# Patient Record
Sex: Male | Born: 1965 | Race: White | Hispanic: No | Smoking: Former smoker
Health system: Southern US, Community
[De-identification: ages and names within clinical notes are randomized; demographics above are authoritative.]

## PROBLEM LIST (undated history)

## (undated) DIAGNOSIS — F418 Other specified anxiety disorders: Secondary | ICD-10-CM

## (undated) DIAGNOSIS — K219 Gastro-esophageal reflux disease without esophagitis: Secondary | ICD-10-CM

## (undated) DIAGNOSIS — E785 Hyperlipidemia, unspecified: Secondary | ICD-10-CM

## (undated) DIAGNOSIS — G4730 Sleep apnea, unspecified: Secondary | ICD-10-CM

## (undated) DIAGNOSIS — I10 Essential (primary) hypertension: Secondary | ICD-10-CM

## (undated) DIAGNOSIS — M1990 Unspecified osteoarthritis, unspecified site: Secondary | ICD-10-CM

## (undated) DIAGNOSIS — Z972 Presence of dental prosthetic device (complete) (partial): Secondary | ICD-10-CM

## (undated) DIAGNOSIS — R519 Headache, unspecified: Secondary | ICD-10-CM

## (undated) DIAGNOSIS — E119 Type 2 diabetes mellitus without complications: Secondary | ICD-10-CM

## (undated) DIAGNOSIS — M549 Dorsalgia, unspecified: Secondary | ICD-10-CM

## (undated) HISTORY — DX: Presence of dental prosthetic device (complete) (partial): Z97.2

## (undated) HISTORY — DX: Essential (primary) hypertension: I10

## (undated) HISTORY — PX: HAND SURGERY: SHX662

## (undated) HISTORY — PX: CARDIAC CATHETERIZATION: SHX172

## (undated) HISTORY — DX: Headache, unspecified: R51.9

## (undated) HISTORY — DX: Hyperlipidemia, unspecified: E78.5

## (undated) HISTORY — PX: CARPAL TUNNEL RELEASE: SHX101

## (undated) HISTORY — DX: Type 2 diabetes mellitus without complications: E11.9

## (undated) HISTORY — DX: Other specified anxiety disorders: F41.8

## (undated) HISTORY — DX: Gastro-esophageal reflux disease without esophagitis: K21.9

## (undated) HISTORY — DX: Dorsalgia, unspecified: M54.9

## (undated) HISTORY — PX: COLONOSCOPY: SHX174

---

## 1998-10-03 ENCOUNTER — Encounter (HOSPITAL_COMMUNITY): Admission: RE | Admit: 1998-10-03 | Discharge: 1998-11-25 | Payer: Self-pay | Admitting: Psychiatry

## 2001-01-13 ENCOUNTER — Ambulatory Visit: Admission: RE | Admit: 2001-01-13 | Discharge: 2001-01-13 | Payer: Self-pay | Admitting: Family Medicine

## 2001-06-20 ENCOUNTER — Encounter: Admission: RE | Admit: 2001-06-20 | Discharge: 2001-09-18 | Payer: Self-pay | Admitting: Family Medicine

## 2002-10-11 ENCOUNTER — Encounter: Admission: RE | Admit: 2002-10-11 | Discharge: 2002-10-11 | Payer: Self-pay | Admitting: *Deleted

## 2002-10-11 ENCOUNTER — Encounter: Payer: Self-pay | Admitting: *Deleted

## 2002-10-12 ENCOUNTER — Ambulatory Visit (HOSPITAL_COMMUNITY): Admission: RE | Admit: 2002-10-12 | Discharge: 2002-10-12 | Payer: Self-pay | Admitting: *Deleted

## 2011-10-20 ENCOUNTER — Other Ambulatory Visit: Payer: Self-pay | Admitting: Family Medicine

## 2011-10-22 ENCOUNTER — Other Ambulatory Visit: Payer: Self-pay | Admitting: Family Medicine

## 2013-05-10 ENCOUNTER — Other Ambulatory Visit: Payer: Self-pay | Admitting: Physician Assistant

## 2013-05-12 ENCOUNTER — Other Ambulatory Visit: Payer: Self-pay | Admitting: Physician Assistant

## 2013-12-22 ENCOUNTER — Other Ambulatory Visit: Payer: Self-pay | Admitting: Physician Assistant

## 2013-12-22 ENCOUNTER — Ambulatory Visit
Admission: RE | Admit: 2013-12-22 | Discharge: 2013-12-22 | Disposition: A | Discharge status: Home / Self Care | Payer: Managed Care, Other (non HMO) | Source: Ambulatory Visit | Attending: Physician Assistant | Admitting: Physician Assistant

## 2013-12-22 DIAGNOSIS — R609 Edema, unspecified: Secondary | ICD-10-CM

## 2013-12-22 DIAGNOSIS — R52 Pain, unspecified: Secondary | ICD-10-CM

## 2013-12-22 DIAGNOSIS — IMO0001 Reserved for inherently not codable concepts without codable children: Secondary | ICD-10-CM

## 2014-08-11 ENCOUNTER — Ambulatory Visit (INDEPENDENT_AMBULATORY_CARE_PROVIDER_SITE_OTHER): Payer: Managed Care, Other (non HMO) | Admitting: Emergency Medicine

## 2014-08-11 ENCOUNTER — Ambulatory Visit (INDEPENDENT_AMBULATORY_CARE_PROVIDER_SITE_OTHER): Payer: Managed Care, Other (non HMO)

## 2014-08-11 VITALS — BP 116/64 | HR 81 | Temp 98.4°F | Resp 16 | Ht 72.0 in | Wt 212.0 lb

## 2014-08-11 DIAGNOSIS — M4310 Spondylolisthesis, site unspecified: Secondary | ICD-10-CM

## 2014-08-11 DIAGNOSIS — M62838 Other muscle spasm: Secondary | ICD-10-CM

## 2014-08-11 DIAGNOSIS — M545 Low back pain, unspecified: Secondary | ICD-10-CM

## 2014-08-11 LAB — POCT URINALYSIS DIPSTICK
Bilirubin, UA: NEGATIVE
Blood, UA: NEGATIVE
GLUCOSE UA: 500
Ketones, UA: NEGATIVE
Leukocytes, UA: NEGATIVE
Nitrite, UA: NEGATIVE
Protein, UA: NEGATIVE
Spec Grav, UA: 1.01
Urobilinogen, UA: 0.2
pH, UA: 5

## 2014-08-11 LAB — POCT UA - MICROSCOPIC ONLY
Bacteria, U Microscopic: NEGATIVE
CASTS, UR, LPF, POC: NEGATIVE
Crystals, Ur, HPF, POC: NEGATIVE
Mucus, UA: NEGATIVE
RBC, urine, microscopic: NEGATIVE
YEAST UA: NEGATIVE

## 2014-08-11 MED ORDER — TIZANIDINE HCL 4 MG PO TABS: 4.0000 mg | ORAL_TABLET | Freq: Every day | ORAL | Status: DC

## 2014-08-11 NOTE — Progress Notes (Signed)
08/11/2014 at 3:54 PM  Jeff Day / DOB: 01/28/1966 / MRN: [id number]  The patient  does not have a problem list on file.  SUBJECTIVE  Chief complaint: Back Pain   Jeff Day is a 49 y.o. male complaining of stable "sharp" right side lower right back pain that started 10 days ago. Associated symptoms include no other symptoms, and he denies weakness, numbness, tingling, dysuria, leg pain, leg weakness.Treatments tried thus far include prescription NSAIDS and acetaminophen with fair  relief. He denies fever, nausea, dysuria, frequency and urgency.  He has a 7 year history of diabettes and started taking an SGLT2 inhibitor in September of 2015.    He  has no past medical history on file.    Medications reviewed and updated by myself where necessary, and exist elsewhere in the encounter.   Jeff Day has No Known Allergies. He  reports that he has quit smoking. He does not have any smokeless tobacco history on file. He  has no sexual activity history on file. The patient  has no past surgical history on file.  His family history is not on file.  ROS  Per HPI  OBJECTIVE  His  height is 6' (1.829 m) and weight is 212 lb (96.163 kg). His temperature is 98.4 F (36.9 C). His blood pressure is 116/64 and his pulse is 81. His respiration is 16 and oxygen saturation is 98%.  The patient's body mass index is 28.75 kg/(m^2).  Physical Exam  Constitutional: He is oriented to person, place, and time. He appears well-developed.  Cardiovascular: Normal rate.   Respiratory: Effort normal.  GI: Soft.  Musculoskeletal: Normal range of motion.       Lumbar back: He exhibits normal range of motion, no tenderness, no bony tenderness and no spasm.       Back:  Neurological: He is alert and oriented to person, place, and time. He has normal strength. No cranial nerve deficit. He exhibits normal muscle tone. Gait normal.  Reflex Scores:      Patellar reflexes are 1+ on the right side and 1+  on the left side.      Achilles reflexes are 1+ on the right side and 1+ on the left side. Skin: He is not diaphoretic.    Results for orders placed or performed in visit on 08/11/14 (from the past 24 hour(s))  POCT urinalysis dipstick     Status: None   Collection Time: 08/11/14  8:54 AM  Result Value Ref Range   Color, UA yellow    Clarity, UA clear    Glucose, UA 500    Bilirubin, UA neg    Ketones, UA neg    Spec Grav, UA 1.010    Blood, UA neg    pH, UA 5.0    Protein, UA neg    Urobilinogen, UA 0.2    Nitrite, UA neg    Leukocytes, UA Negative   POCT UA - Microscopic Only     Status: None   Collection Time: 08/11/14  8:54 AM  Result Value Ref Range   WBC, Ur, HPF, POC 0-1    RBC, urine, microscopic neg    Bacteria, U Microscopic neg    Mucus, UA neg    Epithelial cells, urine per micros 0-2    Crystals, Ur, HPF, POC neg    Casts, Ur, LPF, POC neg    Yeast, UA neg    [facility] reading (PRIMARY) by  Dr. Cleta Albertsaub: Grade 1  anterolisthesis of L5 on S1. No other osseous abnormality.   ASSESSMENT & PLAN  Jeff Day was seen today for back pain.  Diagnoses and all orders for this visit:  Right-sided low back pain without sciatica: Advised patient to take tylenol 1 gram q8 hours.  He is to avoid Aleve, Ibuprofen, ASA, and other NSAID pain medications as he is taking Celebrex daily.  Orders: -     POCT urinalysis dipstick -     POCT UA - Microscopic Only -     DG Lumbar Spine Complete; Future  Spondylisthesis Orders: -     Ambulatory referral to Physical Therapy -     Ambulatory referral to Neurosurgery  Muscle spasm: Orders: -     tiZANidine (ZANAFLEX) 4 MG tablet; Take 1 tablet (4 mg total) by mouth at bedtime.    The patient was advised to call or come back to clinic if he does not see an improvement in symptoms, or worsens with the above plan.   Deliah Boston, MHS, PA-C [facility] 08/11/2014 3:54 PM

## 2014-09-07 ENCOUNTER — Ambulatory Visit: Payer: Managed Care, Other (non HMO) | Admitting: Neurology

## 2014-09-10 ENCOUNTER — Ambulatory Visit: Payer: Self-pay | Admitting: Neurology

## 2014-09-11 ENCOUNTER — Encounter: Payer: Self-pay | Admitting: Neurology

## 2014-09-11 ENCOUNTER — Ambulatory Visit (INDEPENDENT_AMBULATORY_CARE_PROVIDER_SITE_OTHER): Payer: Managed Care, Other (non HMO) | Admitting: Neurology

## 2014-09-11 VITALS — BP 120/79 | HR 60 | Ht 72.0 in | Wt 209.0 lb

## 2014-09-11 DIAGNOSIS — M545 Low back pain: Secondary | ICD-10-CM | POA: Diagnosis not present

## 2014-09-11 NOTE — Progress Notes (Signed)
PATIENT: Jeff OchoaJames L Day DOB: 10/12/1965  Chief Complaint  Patient presents with  . Back Pain    Reports worsening of back pain recently.  He becomes more uncomfortable with walking and lying on his right side.  Says the pain does not radiate to legs.  He recently had an abnormal x-ray with his PCP.    HISTORICAL  Jeff Day is a 49 years old right-handed male, accompanied by his wife, referred by primary care physician nurse practitioner Jeff LloydNoelle Day for evaluation of low back pain.  He has history of hypertension, hyperlipidemia, diabetes, is a truck driver, also require heavy lifting  He reported a history of low back pain for more than 12 years, intermittent, in April 2015 after pulling heavy object across the yard, he began to have worsening low back pain, across midline lower back, no radiating pain, no persistent bilateral lower extremity burns no weakness, no gait difficulty, he was able to continue his job  Prolonged walking, such [place] [employer] would trigger his low back pain, he denies bowel and bladder incontinence, no gait difficulty,  Tylenol 500 mg 3 tablets twice a day was helpful, he had a history of acid reflux, hiatal hernia, could not tolerate NSAIDs  We have reviewed x-ray August 11 2014, Study is limited by poor technique. No obvious vertebral compression fracture. Moderate narrowing of the L4-5 and L5-S1 discs. Slight anterolisthesis L4 upon L5  REVIEW OF SYSTEMS: Full 14 system review of systems performed and notable only for easy bruising, cramps, aching muscles  ALLERGIES: No Known Allergies  HOME MEDICATIONS: Current Outpatient Prescriptions  Medication Sig Dispense Refill  . ACCU-CHEK AVIVA PLUS test strip USE AS DIRECTED 100 strip 0  . celecoxib (CELEBREX) 200 MG capsule Take 200 mg by mouth 2 (two) times daily.    . dapagliflozin propanediol (FARXIGA) 5 MG TABS tablet Take 5 mg by mouth daily.    Marland Kitchen. ezetimibe-simvastatin (VYTORIN)  10-40 MG per tablet Take 1 tablet by mouth daily.    . fenofibrate 160 MG tablet Take 160 mg by mouth daily.    Marland Kitchen. lisinopril (PRINIVIL,ZESTRIL) 40 MG tablet Take 40 mg by mouth daily.    . metFORMIN (GLUCOPHAGE) 1000 MG tablet Take 1,000 mg by mouth 2 (two) times daily with a meal.    . sitaGLIPtin (JANUVIA) 100 MG tablet Take 100 mg by mouth daily.    Marland Kitchen. VICTOZA 18 MG/3ML SOPN   2     PAST MEDICAL HISTORY: Past Medical History  Diagnosis Date  . Diabetes   . Hypertension   . Hyperlipemia   . Back pain     PAST SURGICAL HISTORY: Past Surgical History  Procedure Laterality Date  . Hand surgery Right     2006  . Cardiac catheterization      Normal    FAMILY HISTORY: Family History  Problem Relation Age of Onset  . Obesity Mother   . Cirrhosis Father   . Alcoholism Father   . Diabetes Sister   . Diabetes Paternal Grandfather   . Diabetes Maternal Grandfather     SOCIAL HISTORY:  History   Social History  . Marital Status: Married    Spouse Name: N/A  . Number of Children: 0  . Years of Education: 9th grade   Occupational History  . Truck Hospital doctorDriver    Social History Main Topics  . Smoking status: Former Games developermoker  . Smokeless tobacco: Former NeurosurgeonUser     Comment: Quit 2001  .  Alcohol Use: No  . Drug Use: No  . Sexual Activity: Not on file   Other Topics Concern  . Not on file   Social History Narrative   Lives at home with his wife.   No caffeine use.   Right-handed.       PHYSICAL EXAM   Filed Vitals:   09/11/14 0815  BP: 120/79  Pulse: 60  Height: 6' (1.829 m)  Weight: 209 lb (94.802 kg)    Not recorded      Body mass index is 28.34 kg/(m^2).  PHYSICAL EXAMNIATION:  Gen: NAD, conversant, well nourised, obese, well groomed                     Cardiovascular: Regular rate rhythm, no peripheral edema, warm, nontender. Eyes: Conjunctivae clear without exudates or hemorrhage Neck: Supple, no carotid bruise. Pulmonary: Clear to auscultation  bilaterally   NEUROLOGICAL EXAM:  MENTAL STATUS: Speech:    Speech is normal; fluent and spontaneous with normal comprehension.  Cognition:    The patient is oriented to person, place, and time;     recent and remote memory intact;     language fluent;     normal attention, concentration,     fund of knowledge.  CRANIAL NERVES: CN II: Visual fields are full to confrontation. Fundoscopic exam is normal with sharp discs and no vascular changes. Venous pulsations are present bilaterally. Pupils are 4 mm and briskly reactive to light. Visual acuity is 20/20 bilaterally. CN III, IV, VI: extraocular movement are normal. No ptosis. CN V: Facial sensation is intact to pinprick in all 3 divisions bilaterally. Corneal responses are intact.  CN VII: Face is symmetric with normal eye closure and smile. CN VIII: Hearing is normal to rubbing fingers CN IX, X: Palate elevates symmetrically. Phonation is normal. CN XI: Head turning and shoulder shrug are intact CN XII: Tongue is midline with normal movements and no atrophy.  MOTOR: There is no pronator drift of out-stretched arms. Muscle bulk and tone are normal. Muscle strength is normal.  REFLEXES: Reflexes are 2+ and symmetric at the biceps, triceps, knees, and ankles. Plantar responses are flexor.  SENSORY: Light touch, pinprick, position sense, and vibration sense are intact in fingers and toes.  COORDINATION: Rapid alternating movements and fine finger movements are intact. There is no dysmetria on finger-to-nose and heel-knee-shin. There are no abnormal or extraneous movements.   GAIT/STANCE: Posture is normal. Gait is steady with normal steps, base, arm swing, and turning. Heel and toe walking are normal. Tandem gait is normal.  Romberg is absent.   DIAGNOSTIC DATA (LABS, IMAGING, TESTING) - I reviewed patient records, labs, notes, testing and imaging myself where available.  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 Ph.D.  [facility] [address] [place], [place] [address] Ph: [phone] Fax: [phone]

## 2017-08-03 ENCOUNTER — Other Ambulatory Visit: Payer: Self-pay | Admitting: Physician Assistant

## 2017-08-03 DIAGNOSIS — R519 Headache, unspecified: Secondary | ICD-10-CM

## 2017-08-03 DIAGNOSIS — R51 Headache: Principal | ICD-10-CM

## 2017-08-05 ENCOUNTER — Ambulatory Visit
Admission: RE | Admit: 2017-08-05 | Discharge: 2017-08-05 | Disposition: A | Discharge status: Home / Self Care | Payer: Managed Care, Other (non HMO) | Source: Ambulatory Visit | Attending: Physician Assistant | Admitting: Physician Assistant

## 2017-08-05 DIAGNOSIS — R51 Headache: Principal | ICD-10-CM

## 2017-08-05 DIAGNOSIS — R519 Headache, unspecified: Secondary | ICD-10-CM

## 2018-12-08 IMAGING — CT CT HEAD W/O CM
1 series · 16 of 30 positions shown, 20 images · non-contrast
Comparison: None.

CLINICAL DATA: Headache

EXAM:
CT HEAD WITHOUT CONTRAST
TECHNIQUE: Contiguous axial images were obtained from the base of the skull
through the vertex without intravenous contrast.

[Series 2: head w/(date) · axial · 0.48mm/px · z∈[+1012,+1157]mm · 16 of 33 slices shown, 20 images]
[im 2/33  brain]
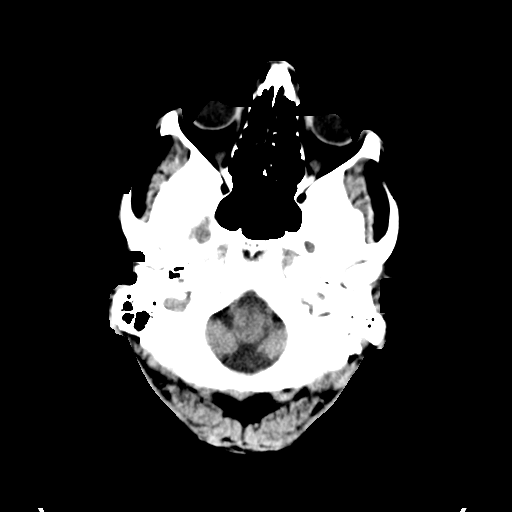
[im 2/33  bone]
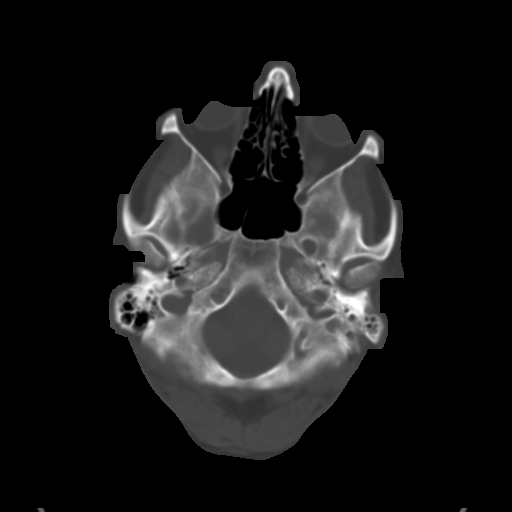
[im 4/33  brain]
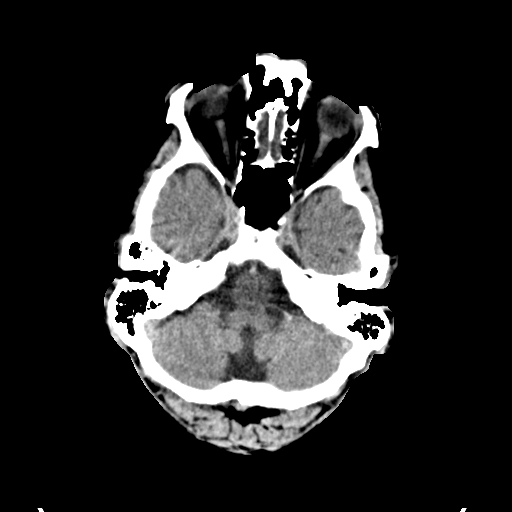
[im 6/33  brain]
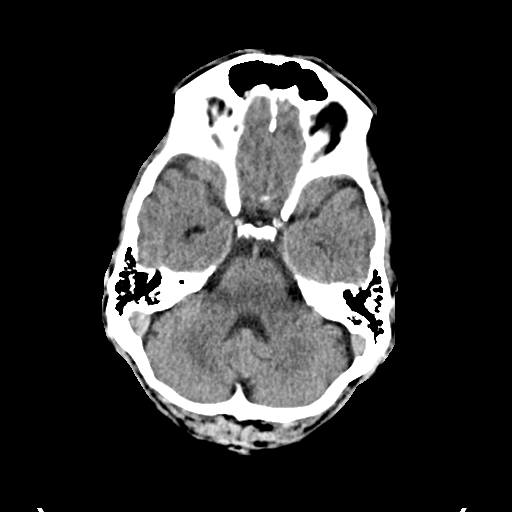
[im 8/33  brain]
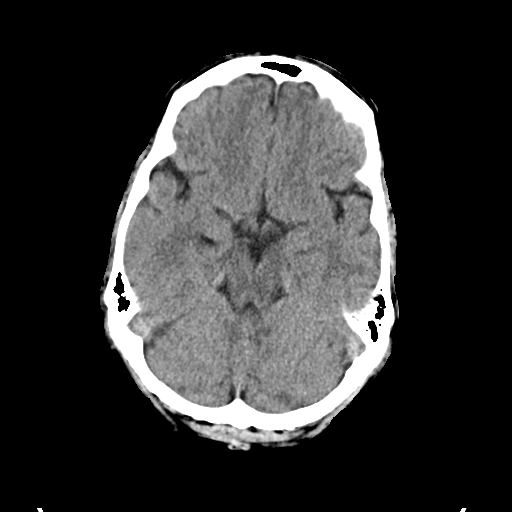
[im 9/33  brain]
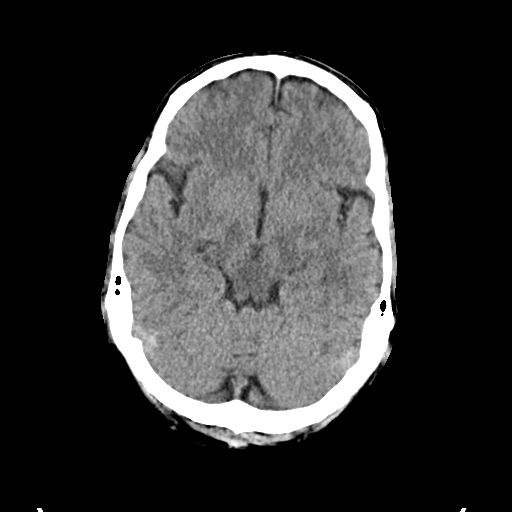
[im 9/33  bone]
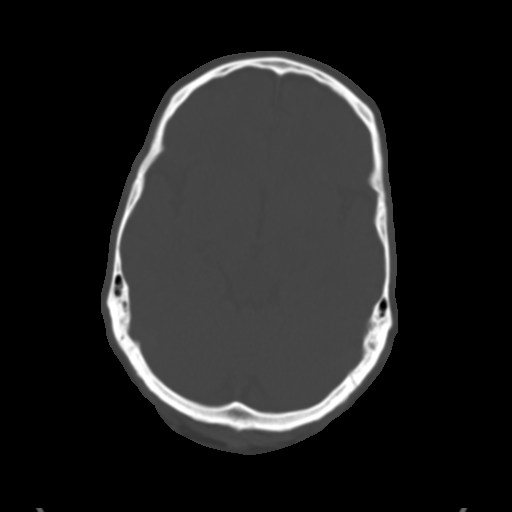
[im 12/33  brain]
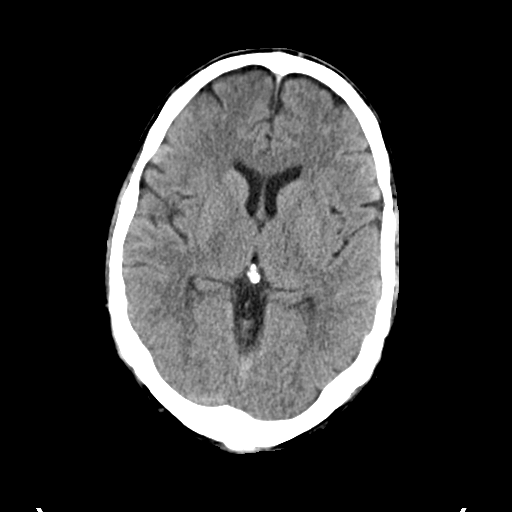
[im 14/33  brain]
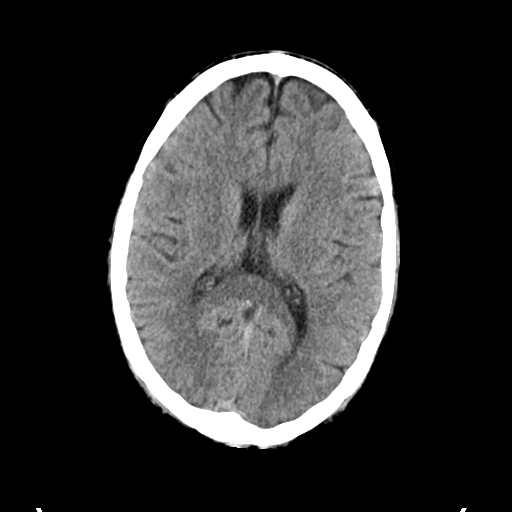
[im 16/33  brain]
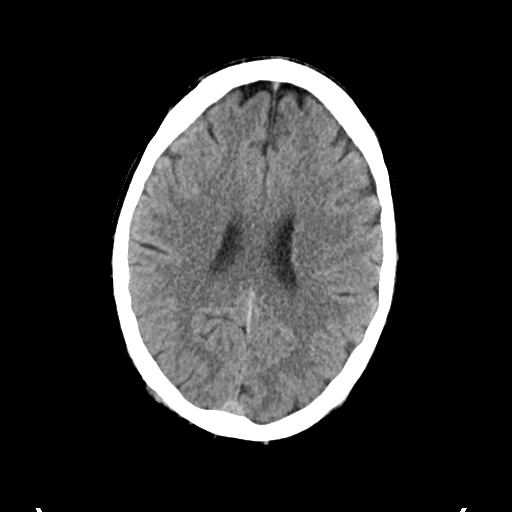
[im 17/33  brain]
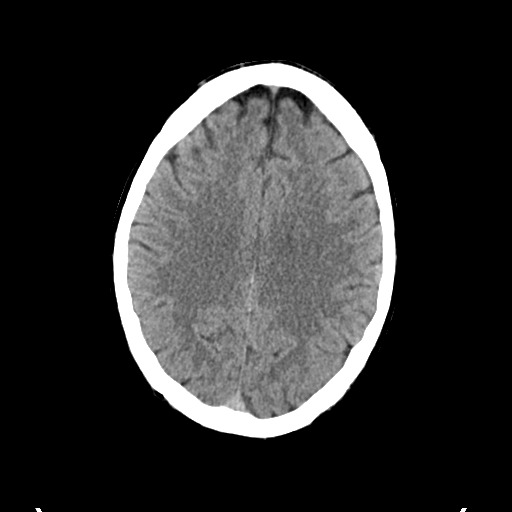
[im 17/33  bone]
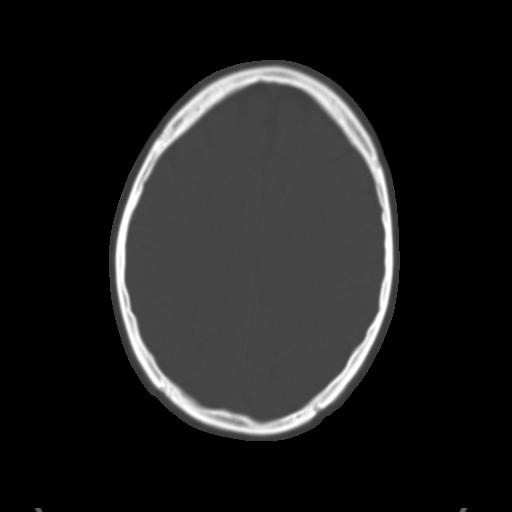
[im 19/33  brain]
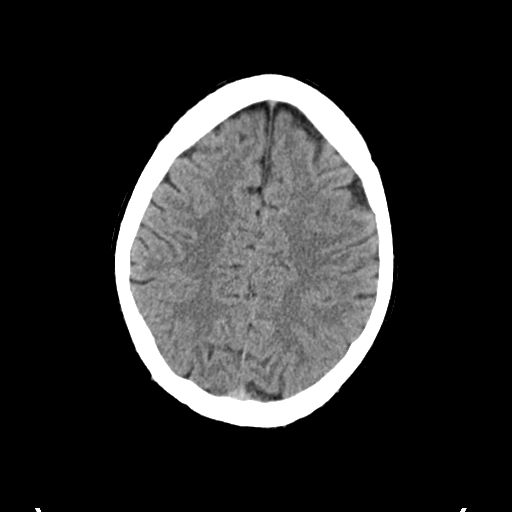
[im 21/33  brain]
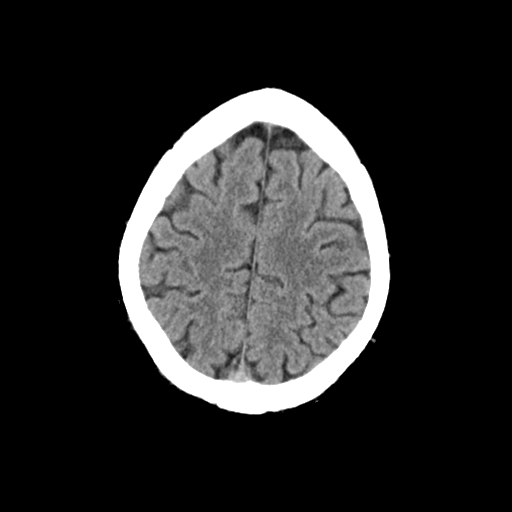
[im 24/33  brain]
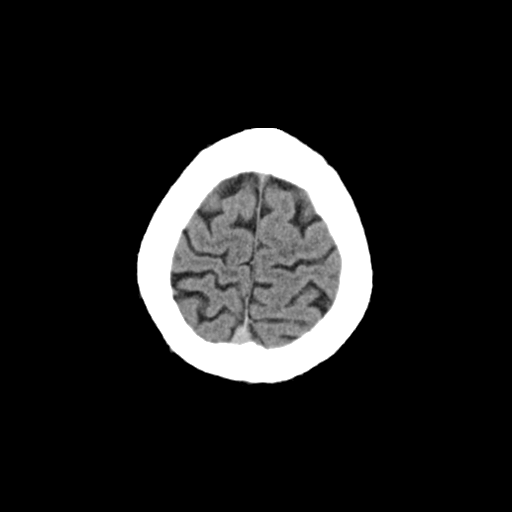
[im 25/33  brain]
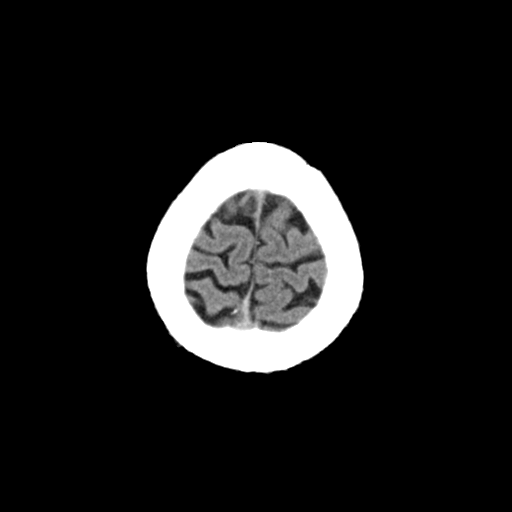
[im 25/33  bone]
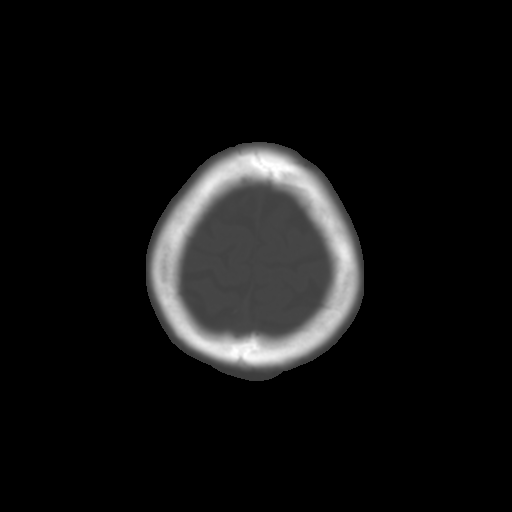
[im 27/33  brain]
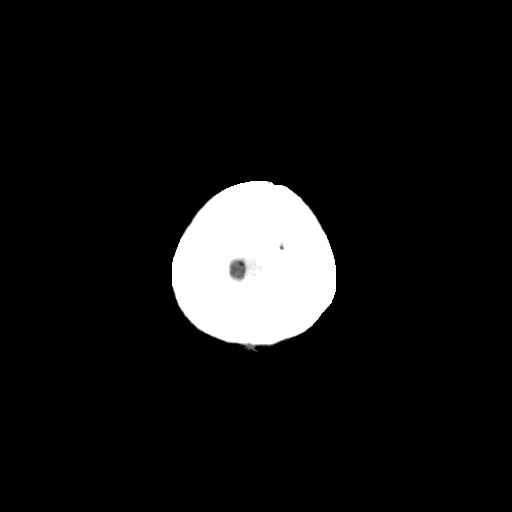
[im 29/33  brain]
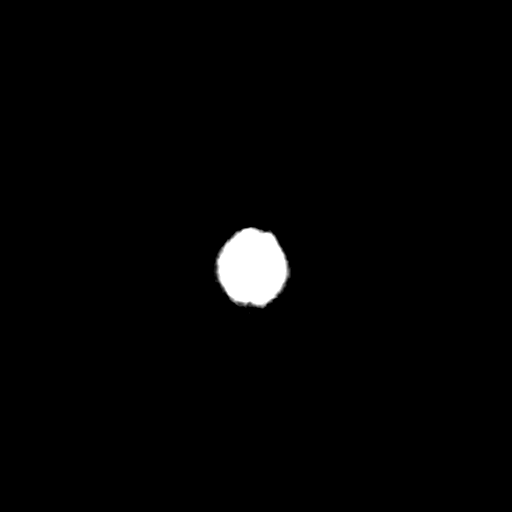
[im 31/33  brain]
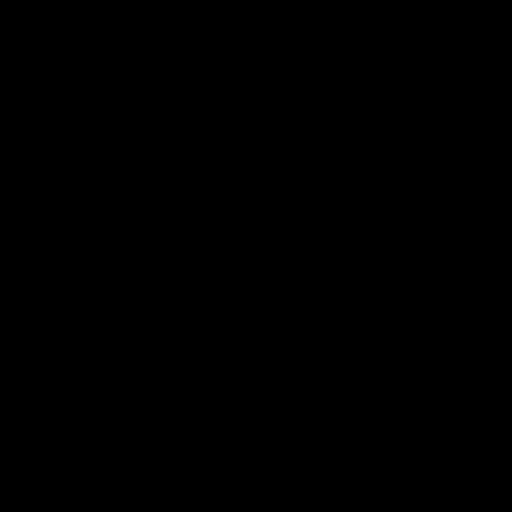

[16 of 30 positions shown; findings below may reference images not displayed]

FINDINGS: Brain: No evidence of acute infarction, hemorrhage, hydrocephalus,
extra-axial collection or mass lesion/mass effect.

Vascular: Negative for hyperdense vessel. Calcification distal left
vertebral artery.

Skull: Negative

Sinuses/Orbits: Negative

Other: None
IMPRESSION: No acute intracranial abnormality.

## 2019-04-27 ENCOUNTER — Institutional Professional Consult (permissible substitution): Payer: Managed Care, Other (non HMO) | Admitting: Neurology

## 2019-11-23 ENCOUNTER — Encounter: Payer: Self-pay | Admitting: *Deleted

## 2019-11-24 ENCOUNTER — Ambulatory Visit: Payer: Managed Care, Other (non HMO) | Admitting: Neurology

## 2019-11-24 ENCOUNTER — Encounter: Payer: Self-pay | Admitting: Neurology

## 2019-11-24 ENCOUNTER — Institutional Professional Consult (permissible substitution): Payer: Managed Care, Other (non HMO) | Admitting: Neurology

## 2019-11-24 ENCOUNTER — Other Ambulatory Visit: Payer: Self-pay

## 2019-11-24 VITALS — BP 117/65 | HR 114 | Ht 71.0 in | Wt 185.2 lb

## 2019-11-24 DIAGNOSIS — R519 Headache, unspecified: Secondary | ICD-10-CM | POA: Diagnosis not present

## 2019-11-24 MED ORDER — BUTALBITAL-APAP-CAFFEINE 50-325-40 MG PO TABS
1.0000 | ORAL_TABLET | Freq: Two times a day (BID) | ORAL | 5 refills | Status: DC | PRN
Start: 2019-11-24 — End: 2022-09-28

## 2019-11-24 NOTE — Patient Instructions (Signed)
Over-the-counter melatonin  before you go to sleep  Stop daily Tylenol use  Fioricet as needed for moderate to severe headache, limit the use to less than twice each week

## 2019-11-24 NOTE — Progress Notes (Signed)
HISTORICAL  Jeff Day is a 54 year old male, seen in request by his primary care PA Redmon, Noelle for evaluation of headaches, initial evaluation was on November 24, 2019  I reviewed and summarized the referring note.  Past medical history Diabetes Hyperlipidemia Depression History of bilateral carpal tunnel release surgery  He presented with headaches since 2019, denies a previous history of headache, his headache usually located at bilateral occipital, frontal, move around, sometimes bilateral temporal region, pressure, behind his eyes, he denies significant light noise sensitivity, no nausea, no significant worsening with movement, he has daily headaches throughout the day, 2 out of 10 on a daily basis, sometimes exacerbated to 8 out of 10, lasting 10 to 15 minutes, he works as a Naval architect, has irregular sleeping schedule, often drives at evening, nighttime, he has been taking frequent Tylenol arthritis for his joints pain, and headaches, 3 tablets at one time, 2-3 times each day, was not sure about the benefit of that  I personally reviewed CT head without contrast in April 2019 that was normal  Laboratory evaluation on November 17, 2018, normal CMP with exception of elevated glucose 154, A1c 8.8, normal PSA 0.3, LDL 32 triglyceride 402, 2016  He reported his depression anxiety is under okay control, was started on aimovig 70 mg every month around February 2021, initially was helpful, now was not sure about the benefit  I saw him previously for low back pain, after jerked his back pulling heavy object in his yard, considered to be musculoskeletal etiology, x-ray showed degenerative changes, slight anterolisthesis L4 on 5, moderate narrowing of lower lumbar foraminal,   REVIEW OF SYSTEMS: Full 14 system review of systems performed and notable only for as above All other review of systems were negative.  ALLERGIES: No Known Allergies  HOME MEDICATIONS: Current Outpatient  Medications  Medication Sig Dispense Refill  . ACCU-CHEK AVIVA PLUS test strip USE AS DIRECTED 100 strip 0  . buPROPion (WELLBUTRIN XL) 150 MG 24 hr tablet Take 150 mg by mouth daily.    . celecoxib (CELEBREX) 200 MG capsule Take 200 mg by mouth 2 (two) times daily.    . dapagliflozin propanediol (FARXIGA) 5 MG TABS tablet Take 10 mg by mouth daily.     . Dulaglutide (TRULICITY) 1.5 MG/0.5ML SOPN Inject 1.5 mg into the skin once a week.    Dorise Hiss (AIMOVIG, 140 MG DOSE,) 70 MG/ML SOAJ Inject 140 mg into the skin every 30 (thirty) days.    Marland Kitchen escitalopram (LEXAPRO) 20 MG tablet Take 20 mg by mouth daily.    Marland Kitchen ezetimibe (ZETIA) 10 MG tablet Take 10 mg by mouth daily.    . fenofibrate 160 MG tablet Take 160 mg by mouth daily.    . metFORMIN (GLUCOPHAGE) 1000 MG tablet Take 1,000 mg by mouth 2 (two) times daily with a meal.    . omeprazole (PRILOSEC) 20 MG capsule Take 20 mg by mouth daily.    . rosuvastatin (CRESTOR) 20 MG tablet Take 20 mg by mouth daily.    . sitaGLIPtin (JANUVIA) 100 MG tablet Take 100 mg by mouth daily.    . valACYclovir (VALTREX) 500 MG tablet Take 500 mg by mouth as needed.     No current facility-administered medications for this visit.    PAST MEDICAL HISTORY: Past Medical History:  Diagnosis Date  . Back pain   . Depression with anxiety   . Diabetes (HCC)   . GERD (gastroesophageal reflux disease)   . Headache   .  Hyperlipemia   . Hypertension   . Wears dentures     PAST SURGICAL HISTORY: Past Surgical History:  Procedure Laterality Date  . CARDIAC CATHETERIZATION     Normal  . CARPAL TUNNEL RELEASE    . COLONOSCOPY    . HAND SURGERY Right    2006    FAMILY HISTORY: Family History  Problem Relation Age of Onset  . Obesity Mother   . Other Mother        meningitis  . Cirrhosis Father   . Alcoholism Father   . Diabetes Sister   . Diabetes Paternal Grandfather   . Diabetes Maternal Grandfather   . Suicidality Brother     SOCIAL  HISTORY: Social History   Socioeconomic History  . Marital status: Widowed    Spouse name: Not on file  . Number of children: 0  . Years of education: 9th grade  . Highest education level: Not on file  Occupational History  . Occupation: Truck Hospital doctor  Tobacco Use  . Smoking status: Former Games developer  . Smokeless tobacco: Former Neurosurgeon  . Tobacco comment: Quit 2001  Substance and Sexual Activity  . Alcohol use: Yes    Alcohol/week: 0.0 standard drinks    Comment: 4 per month  . Drug use: No  . Sexual activity: Not on file  Other Topics Concern  . Not on file  Social History Narrative   Lives at home with friend.   Three cups caffeine per day.   Right-handed.   Social Determinants of Health   Financial Resource Strain:   . Difficulty of Paying Living Expenses:   Food Insecurity:   . Worried About Programme researcher, broadcasting/film/video in the Last Year:   . Barista in the Last Year:   Transportation Needs:   . Freight forwarder (Medical):   Marland Kitchen Lack of Transportation (Non-Medical):   Physical Activity:   . Days of Exercise per Week:   . Minutes of Exercise per Session:   Stress:   . Feeling of Stress :   Social Connections:   . Frequency of Communication with Friends and Family:   . Frequency of Social Gatherings with Friends and Family:   . Attends Religious Services:   . Active Member of Clubs or Organizations:   . Attends Banker Meetings:   Marland Kitchen Marital Status:   Intimate Partner Violence:   . Fear of Current or Ex-Partner:   . Emotionally Abused:   Marland Kitchen Physically Abused:   . Sexually Abused:      PHYSICAL EXAM   Vitals:   11/24/19 0915  BP: 117/65  Pulse: (!) 114  Weight: 185 lb 3.2 oz (84 kg)  Height: 5\' 11"  (1.803 m)   Not recorded     Body mass index is 25.83 kg/m.  PHYSICAL EXAMNIATION:  Gen: NAD, conversant, well nourised, well groomed                     Cardiovascular: Regular rate rhythm, no peripheral edema, warm, nontender. Eyes:  Conjunctivae clear without exudates or hemorrhage Neck: Supple, no carotid bruits. Pulmonary: Clear to auscultation bilaterally   NEUROLOGICAL EXAM:  MENTAL STATUS: Speech:    Speech is normal; fluent and spontaneous with normal comprehension.  Cognition:     Orientation to time, place and person     Normal recent and remote memory     Normal Attention span and concentration     Normal Language, naming, repeating,spontaneous speech  Fund of knowledge   CRANIAL NERVES: CN II: Visual fields are full to confrontation. Pupils are round equal and briskly reactive to light. CN III, IV, VI: extraocular movement are normal. No ptosis. CN V: Facial sensation is intact to light touch CN VII: Face is symmetric with normal eye closure  CN VIII: Hearing is normal to causal conversation. CN IX, X: Phonation is normal. CN XI: Head turning and shoulder shrug are intact  MOTOR: There is no pronator drift of out-stretched arms. Muscle bulk and tone are normal. Muscle strength is normal.  REFLEXES: Reflexes are 2+ and symmetric at the biceps, triceps, knees, and ankles. Plantar responses are flexor.  SENSORY: Intact to light touch, pinprick and vibratory sensation are intact in fingers and toes.  COORDINATION: There is no trunk or limb dysmetria noted.  GAIT/STANCE: Posture is normal. Gait is steady with normal steps, base, arm swing, and turning. Heel and toe walking are normal. Tandem gait is normal.  Romberg is absent.   DIAGNOSTIC DATA (LABS, IMAGING, TESTING) - I reviewed patient records, labs, notes, testing and imaging myself where available.   ASSESSMENT AND PLAN  Jeff Day is a 54 y.o. male   Chronic headaches  Likely tension headache with component of medicine rebound headache  Advised him to stop daily ibuprofen, Tylenol use  Fioricet as needed for moderate severe headache, limit the use to less than twice each week  Laboratory as needed for sleep,   Levert Feinstein,  M.D. Ph.D.  [facility] [address], [address] [place], [place] [address] Ph: [phone] Fax: [phone]  CC:  Milus Height, [place] [address] [place],  [place] [address]

## 2020-02-29 ENCOUNTER — Ambulatory Visit: Payer: Managed Care, Other (non HMO) | Admitting: Neurology

## 2021-09-04 ENCOUNTER — Other Ambulatory Visit: Payer: Self-pay | Admitting: Physician Assistant

## 2021-09-04 DIAGNOSIS — R1010 Upper abdominal pain, unspecified: Secondary | ICD-10-CM

## 2021-09-08 ENCOUNTER — Ambulatory Visit
Admission: RE | Admit: 2021-09-08 | Discharge: 2021-09-08 | Disposition: A | Discharge status: Home / Self Care | Payer: Managed Care, Other (non HMO) | Source: Ambulatory Visit | Attending: Physician Assistant | Admitting: Physician Assistant

## 2021-09-08 DIAGNOSIS — R1010 Upper abdominal pain, unspecified: Secondary | ICD-10-CM

## 2021-11-04 ENCOUNTER — Other Ambulatory Visit: Payer: Self-pay | Admitting: Family Medicine

## 2021-11-04 DIAGNOSIS — K429 Umbilical hernia without obstruction or gangrene: Secondary | ICD-10-CM

## 2021-11-11 ENCOUNTER — Ambulatory Visit
Admission: RE | Admit: 2021-11-11 | Discharge: 2021-11-11 | Disposition: A | Discharge status: Home / Self Care | Payer: Managed Care, Other (non HMO) | Source: Ambulatory Visit | Attending: Family Medicine | Admitting: Family Medicine

## 2021-11-11 DIAGNOSIS — K429 Umbilical hernia without obstruction or gangrene: Secondary | ICD-10-CM

## 2022-04-27 ENCOUNTER — Ambulatory Visit
Admission: EM | Admit: 2022-04-27 | Discharge: 2022-04-27 | Disposition: A | Discharge status: Home / Self Care | Payer: Self-pay | Attending: Nurse Practitioner | Admitting: Nurse Practitioner

## 2022-04-27 ENCOUNTER — Ambulatory Visit: Payer: Managed Care, Other (non HMO)

## 2022-04-27 DIAGNOSIS — M940 Chondrocostal junction syndrome [Tietze]: Secondary | ICD-10-CM

## 2022-04-27 DIAGNOSIS — R0782 Intercostal pain: Secondary | ICD-10-CM

## 2022-04-27 MED ORDER — LIDOCAINE 5 % EX PTCH
1.0000 | MEDICATED_PATCH | CUTANEOUS | 0 refills | Status: DC
Start: 2022-04-27 — End: 2022-09-28

## 2022-04-27 MED ORDER — DICLOFENAC SODIUM 1 % EX GEL: 4.0000 g | Freq: Four times a day (QID) | CUTANEOUS | 0 refills | Status: DC

## 2022-04-27 NOTE — ED Triage Notes (Signed)
Pt reports he fell on a console on his right side and now his ribs hurt x 3 days. Took tylenol, heat, put  salon pas patches on but no relief.

## 2022-04-27 NOTE — ED Provider Notes (Signed)
[facility]    CSN: [id number] Arrival date & time: 04/27/22  1034      History   Chief Complaint Chief Complaint  Patient presents with   Rib Injury    HPI Jeff Day is a 57 y.o. male.   Patient presents today for right sided rib cage pain that began approximately 3 days.  Reports prior to pain started, he and his wife are playing a game in the den and he fell against the middle console made of wood in the middle of his left seat.  He denies any swelling, bruising, or redness to the rib cage area.  Reports it does hurt to take a deep breath.  No new fever, cough or congestion since the pain began.  No chest pain or shortness of breath.  Has tried Tylenol, over-the-counter topical patches and gels, heat for pain without much improvement.    Past Medical History:  Diagnosis Date   Back pain    Depression with anxiety    Diabetes (HCC)    GERD (gastroesophageal reflux disease)    Headache    Hyperlipemia    Hypertension    Wears dentures     Patient Active Problem List   Diagnosis Date Noted   Intractable headache 11/24/2019    Past Surgical History:  Procedure Laterality Date   CARDIAC CATHETERIZATION     Normal   CARPAL TUNNEL RELEASE     COLONOSCOPY     HAND SURGERY Right    2006       Home Medications    Prior to Admission medications   Medication Sig Start Date End Date Taking? Authorizing Provider  diclofenac Sodium (VOLTAREN ARTHRITIS PAIN) 1 % GEL Apply 4 g topically 4 (four) times daily. 04/27/22  Yes Cathlean Marseilles A, NP  lidocaine (LIDODERM) 5 % Place 1 patch onto the skin daily. Remove & Discard patch within 12 hours or as directed by MD 04/27/22  Yes Valentino Nose, NP  ACCU-CHEK AVIVA PLUS test strip USE AS DIRECTED 10/20/11   Sondra Barges, PA-C  buPROPion (WELLBUTRIN XL) 150 MG 24 hr tablet Take 150 mg by mouth daily.    [provider]  butalbital-acetaminophen-caffeine (FIORICET) 50-325-40 MG tablet Take 1 tablet  by mouth every 12 (twelve) hours as needed for headache. 11/24/19   Levert Feinstein, MD  celecoxib (CELEBREX) 200 MG capsule Take 200 mg by mouth 2 (two) times daily.    [provider]  dapagliflozin propanediol (FARXIGA) 5 MG TABS tablet Take 10 mg by mouth daily.     [provider]  Dulaglutide (TRULICITY) 1.5 MG/0.5ML SOPN Inject 1.5 mg into the skin once a week.    [provider]  Erenumab-aooe (AIMOVIG, 140 MG DOSE,) 70 MG/ML SOAJ Inject 140 mg into the skin every 30 (thirty) days.    [provider]  escitalopram (LEXAPRO) 20 MG tablet Take 20 mg by mouth daily.    [provider]  ezetimibe (ZETIA) 10 MG tablet Take 10 mg by mouth daily.    [provider]  fenofibrate 160 MG tablet Take 160 mg by mouth daily.    [provider]  metFORMIN (GLUCOPHAGE) 1000 MG tablet Take 1,000 mg by mouth 2 (two) times daily with a meal.    [provider]  omeprazole (PRILOSEC) 20 MG capsule Take 20 mg by mouth daily.    [provider]  rosuvastatin (CRESTOR) 20 MG tablet Take 20 mg by mouth daily.  [provider]  sitaGLIPtin (JANUVIA) 100 MG tablet Take 100 mg by mouth daily.    [provider]  valACYclovir (VALTREX) 500 MG tablet Take 500 mg by mouth as needed.    [provider]    Family History Family History  Problem Relation Age of Onset   Obesity Mother    Other Mother        meningitis   Cirrhosis Father    Alcoholism Father    Diabetes Sister    Diabetes Paternal Grandfather    Diabetes Maternal Grandfather    Suicidality Brother     Social History Social History   Tobacco Use   Smoking status: Former   Smokeless tobacco: Former   Tobacco comments:    Quit 2001  Substance Use Topics   Alcohol use: Yes    Alcohol/week: 0.0 standard drinks of alcohol    Comment: 4 per month   Drug use: No     Allergies   Patient has no known allergies.   Review of  Systems Review of Systems Per HPI  Physical Exam Triage Vital Signs ED Triage Vitals  Enc Vitals Group     BP 04/27/22 1153 (!) 151/70     Pulse Rate 04/27/22 1153 79     Resp 04/27/22 1153 20     Temp 04/27/22 1153 98.5 F (36.9 C)     Temp Source 04/27/22 1153 Oral     SpO2 04/27/22 1153 95 %     Weight --      Height --      Head Circumference --      Peak Flow --      Pain Score 04/27/22 1156 10     Pain Loc --      Pain Edu? --      Excl. in [place]? --    No data found.  Updated Vital Signs BP (!) 151/70 (BP Location: Right Arm)   Pulse 79   Temp 98.5 F (36.9 C) (Oral)   Resp 20   SpO2 95%   Visual Acuity Right Eye Distance:   Left Eye Distance:   Bilateral Distance:    Right Eye Near:   Left Eye Near:    Bilateral Near:     Physical Exam Vitals and nursing note reviewed.  Constitutional:      General: He is not in acute distress.    Appearance: Normal appearance. He is not toxic-appearing.  HENT:     Mouth/Throat:     Mouth: Mucous membranes are moist.  Cardiovascular:     Rate and Rhythm: Normal rate and regular rhythm.  Pulmonary:     Effort: Pulmonary effort is normal. No respiratory distress.     Breath sounds: Normal breath sounds. No wheezing, rhonchi or rales.  Chest:     Chest wall: Tenderness present. No deformity, swelling or edema.       Comments: Point tenderness to right lateral chest wall Skin:    General: Skin is warm and dry.     Capillary Refill: Capillary refill takes less than 2 seconds.     Coloration: Skin is not jaundiced or pale.     Findings: No erythema.  Neurological:     Mental Status: He is alert and oriented to person, place, and time.  Psychiatric:        Behavior: Behavior is cooperative.      UC Treatments / Results  Labs (all labs ordered are listed, but only abnormal results are  displayed) Labs Reviewed - No data to display  EKG   Radiology DG Ribs Unilateral W/Chest Right  Result Date:  04/27/2022 CLINICAL DATA:  Rib pain for 3 days EXAM: RIGHT RIBS AND CHEST - 3+ VIEW COMPARISON:  None Available. FINDINGS: No fracture or other bone lesions are seen involving the ribs. There is no evidence of pneumothorax or pleural effusion. Both lungs are clear. Heart size and mediastinal contours are within normal limits. IMPRESSION: No rib fracture identified. Electronically Signed   By: Genevive Bi M.D.   On: 04/27/2022 12:21    Procedures Procedures (including critical care time)  Medications Ordered in UC Medications - No data to display  Initial Impression / Assessment and Plan / UC Course  I have reviewed the triage vital signs and the nursing notes.  Pertinent labs & imaging results that were available during my care of the patient were reviewed by me and considered in my medical decision making (see chart for details).   Patient is well-appearing, normotensive, afebrile, not tachycardic, not tachypneic, oxygenating well on room air.    Costochondritis Rib x-rays negative today for acute rib fracture Discussed costochondritis with patient and wife Supportive care discussed Start Voltaren gel/lidocaine patches as needed ER and return precautions discussed with patient and wife Encouraged deep breathing exercises 10/h while awake  The patient was given the opportunity to ask questions.  All questions answered to their satisfaction.  The patient is in agreement to this plan.    Final Clinical Impressions(s) / UC Diagnoses   Final diagnoses:  Costochondritis     Discharge Instructions      I suspect the cartilage in between your ribs is inflamed.  The rib x-ray today does not show any acute fractures in your bones.  Please make sure you are taking slow deep breaths 10/h while awake.  You can use the topical Voltaren gel or lidocaine patches as needed for pain as well.  Follow-up if you develop fever, cough, congestion, signs or symptoms of pneumonia.    ED  Prescriptions     Medication Sig Dispense Auth. Provider   diclofenac Sodium (VOLTAREN ARTHRITIS PAIN) 1 % GEL Apply 4 g topically 4 (four) times daily. 50 g Cathlean Marseilles A, NP   lidocaine (LIDODERM) 5 % Place 1 patch onto the skin daily. Remove & Discard patch within 12 hours or as directed by MD 30 patch Valentino Nose, NP      PDMP not reviewed this encounter.   Valentino Nose, NP 04/27/22 1313

## 2022-04-27 NOTE — Discharge Instructions (Addendum)
I suspect the cartilage in between your ribs is inflamed.  The rib x-ray today does not show any acute fractures in your bones.  Please make sure you are taking slow deep breaths 10/h while awake.  You can use the topical Voltaren gel or lidocaine patches as needed for pain as well.  Follow-up if you develop fever, cough, congestion, signs or symptoms of pneumonia.

## 2022-05-29 ENCOUNTER — Encounter (HOSPITAL_BASED_OUTPATIENT_CLINIC_OR_DEPARTMENT_OTHER): Payer: Self-pay

## 2022-05-29 ENCOUNTER — Other Ambulatory Visit: Payer: Self-pay

## 2022-05-29 ENCOUNTER — Emergency Department (HOSPITAL_BASED_OUTPATIENT_CLINIC_OR_DEPARTMENT_OTHER)
Admission: EM | Admit: 2022-05-29 | Discharge: 2022-05-29 | Disposition: A | Discharge status: Home / Self Care | Payer: Medicaid Other | Attending: Emergency Medicine | Admitting: Emergency Medicine

## 2022-05-29 ENCOUNTER — Emergency Department (HOSPITAL_BASED_OUTPATIENT_CLINIC_OR_DEPARTMENT_OTHER): Payer: Medicaid Other

## 2022-05-29 DIAGNOSIS — M79662 Pain in left lower leg: Secondary | ICD-10-CM | POA: Diagnosis not present

## 2022-05-29 DIAGNOSIS — Z7984 Long term (current) use of oral hypoglycemic drugs: Secondary | ICD-10-CM | POA: Diagnosis not present

## 2022-05-29 DIAGNOSIS — E119 Type 2 diabetes mellitus without complications: Secondary | ICD-10-CM | POA: Diagnosis not present

## 2022-05-29 DIAGNOSIS — M7989 Other specified soft tissue disorders: Secondary | ICD-10-CM | POA: Insufficient documentation

## 2022-05-29 DIAGNOSIS — Z7901 Long term (current) use of anticoagulants: Secondary | ICD-10-CM | POA: Insufficient documentation

## 2022-05-29 LAB — CBC WITH DIFFERENTIAL/PLATELET
Abs Immature Granulocytes: 0.02 10*3/uL (ref 0.00–0.07)
Basophils Absolute: 0.1 10*3/uL (ref 0.0–0.1)
Basophils Relative: 1 %
Eosinophils Absolute: 0.2 10*3/uL (ref 0.0–0.5)
Eosinophils Relative: 3 %
HCT: 36.2 % — ABNORMAL LOW (ref 39.0–52.0)
Hemoglobin: 11.8 g/dL — ABNORMAL LOW (ref 13.0–17.0)
Immature Granulocytes: 0 %
Lymphocytes Relative: 18 %
Lymphs Abs: 1.3 10*3/uL (ref 0.7–4.0)
MCH: 28.1 pg (ref 26.0–34.0)
MCHC: 32.6 g/dL (ref 30.0–36.0)
MCV: 86.2 fL (ref 80.0–100.0)
Monocytes Absolute: 0.6 10*3/uL (ref 0.1–1.0)
Monocytes Relative: 9 %
Neutro Abs: 5 10*3/uL (ref 1.7–7.7)
Neutrophils Relative %: 69 %
Platelets: 272 10*3/uL (ref 150–400)
RBC: 4.2 MIL/uL — ABNORMAL LOW (ref 4.22–5.81)
RDW: 13.9 % (ref 11.5–15.5)
WBC: 7.1 10*3/uL (ref 4.0–10.5)
nRBC: 0 % (ref 0.0–0.2)

## 2022-05-29 LAB — BASIC METABOLIC PANEL
Anion gap: 10 (ref 5–15)
BUN: 23 mg/dL — ABNORMAL HIGH (ref 6–20)
CO2: 28 mmol/L (ref 22–32)
Calcium: 9.6 mg/dL (ref 8.9–10.3)
Chloride: 99 mmol/L (ref 98–111)
Creatinine, Ser: 1.13 mg/dL (ref 0.61–1.24)
GFR, Estimated: >60 mL/min (ref >60)
Glucose, Bld: 255 mg/dL — ABNORMAL HIGH (ref 70–99)
Potassium: 4.2 mmol/L (ref 3.5–5.1)
Sodium: 137 mmol/L (ref 135–145)

## 2022-05-29 NOTE — Discharge Instructions (Addendum)
As we discussed, your labs are normal and your ultrasound did not show any DVT.  Please use compression stockings and keep your leg elevated  Return to ER if you have worse leg swelling or calf pain or chest pain or shortness of breath

## 2022-05-29 NOTE — ED Notes (Signed)
Pt A&OX4 ambulatory at d/c with independent steady gait. Pt verbalized understanding of d/c instructions and follow up care. 

## 2022-05-29 NOTE — ED Provider Notes (Signed)
[facility] Provider Note   [name] Arrival date & time: 05/29/22  1635     History  Chief Complaint  Patient presents with   Leg Swelling    Jeff Day is a 57 y.o. male history of diabetes, previous DVT off anticoagulation here presenting with left leg swelling and calf pain.  Patient noticed left leg swelling and calf pain for the last week or so.  Patient denies any chest pain or shortness of breath.  Patient was sent here for rule out DVT.  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  Denies any recent surgery or recent travel  The history is provided by the patient.       Home Medications Prior to Admission medications   Medication Sig Start Date End Date Taking? Authorizing Provider  ACCU-CHEK AVIVA PLUS test strip USE AS DIRECTED 10/20/11   Rise Mu, PA-C  buPROPion (WELLBUTRIN XL) 150 MG 24 hr tablet Take 150 mg by mouth daily.    [provider]  butalbital-acetaminophen-caffeine (FIORICET) 50-325-40 MG tablet Take 1 tablet by mouth every 12 (twelve) hours as needed for headache. 11/24/19   Marcial Pacas, MD  celecoxib (CELEBREX) 200 MG capsule Take 200 mg by mouth 2 (two) times daily.    [provider]  dapagliflozin propanediol (FARXIGA) 5 MG TABS tablet Take 10 mg by mouth daily.     [provider]  diclofenac Sodium (VOLTAREN ARTHRITIS PAIN) 1 % GEL Apply 4 g topically 4 (four) times daily. 04/27/22   Eulogio Bear, NP  Dulaglutide (TRULICITY) 1.5 0000000 SOPN Inject 1.5 mg into the skin once a week.    [provider]  Erenumab-aooe (AIMOVIG, 140 MG DOSE,) 70 MG/ML SOAJ Inject 140 mg into the skin every 30 (thirty) days.    [provider]  escitalopram (LEXAPRO) 20 MG tablet Take 20 mg by mouth daily.    [provider]  ezetimibe (ZETIA) 10 MG tablet Take 10 mg by mouth daily.    [provider]   fenofibrate 160 MG tablet Take 160 mg by mouth daily.    [provider]  lidocaine (LIDODERM) 5 % Place 1 patch onto the skin daily. Remove & Discard patch within 12 hours or as directed by MD 04/27/22   Eulogio Bear, NP  metFORMIN (GLUCOPHAGE) 1000 MG tablet Take 1,000 mg by mouth 2 (two) times daily with a meal.    [provider]  omeprazole (PRILOSEC) 20 MG capsule Take 20 mg by mouth daily.    [provider]  rosuvastatin (CRESTOR) 20 MG tablet Take 20 mg by mouth daily.    [provider]  sitaGLIPtin (JANUVIA) 100 MG tablet Take 100 mg by mouth daily.    [provider]  valACYclovir (VALTREX) 500 MG tablet Take 500 mg by mouth as needed.    [provider]      Allergies    Patient has no known allergies.    Review of Systems   Review of Systems  Musculoskeletal:        R leg swelling   All other systems reviewed and are negative.   Physical Exam Updated Vital Signs BP 139/78 (BP Location: Right Arm)   Pulse 93   Temp 98.3 F (36.8 C) (Oral)   Resp 18   Ht 5' 11"$  (1.803 m)   Wt 84 kg   SpO2 97%  BMI 25.83 kg/m  Physical Exam Vitals and nursing note reviewed.  Constitutional:      Appearance: Normal appearance.  HENT:     Head: Normocephalic.     Nose: Nose normal.     Mouth/Throat:     Mouth: Mucous membranes are moist.  Eyes:     Extraocular Movements: Extraocular movements intact.     Pupils: Pupils are equal, round, and reactive to light.  Cardiovascular:     Rate and Rhythm: Normal rate and regular rhythm.     Pulses: Normal pulses.     Heart sounds: Normal heart sounds.  Pulmonary:     Effort: Pulmonary effort is normal.     Breath sounds: Normal breath sounds.  Abdominal:     General: Abdomen is flat.     Palpations: Abdomen is soft.  Musculoskeletal:     Cervical back: Normal range of motion and neck supple.     Comments: Left leg gets more swollen compared to the right.  Mild left  calf tenderness.  Skin:    General: Skin is warm.     Capillary Refill: Capillary refill takes less than 2 seconds.  Neurological:     General: No focal deficit present.     Mental Status: He is alert and oriented to person, place, and time.  Psychiatric:        Mood and Affect: Mood normal.        Behavior: Behavior normal.     ED Results / Procedures / Treatments   Labs (all labs ordered are listed, but only abnormal results are displayed) Labs Reviewed  CBC WITH DIFFERENTIAL/PLATELET  BASIC METABOLIC PANEL    EKG None  Radiology No results found.  Procedures Procedures    Medications Ordered in ED Medications - No data to display  ED Course/ Medical Decision Making/ A&P                             Medical Decision Making SNEH LIGOCKI is a 57 y.o. male here presenting with left calf pain.  Has history of DVT in that leg so high suspicion for DVT.  Plan to get basic blood work and DVT study.  No chest pain or shortness of breath and I have low suspicion for PE.   8:09 PM DVT study is negative.  CBC and BMP unremarkable.  Told him to keep leg elevated and use compression stockings.  Problems Addressed: Left leg swelling: acute illness or injury  Amount and/or Complexity of Data Reviewed Labs: ordered. Decision-making details documented in ED Course.    Final Clinical Impression(s) / ED Diagnoses Final diagnoses:  None    Rx / DC Orders ED Discharge Orders     None         Drenda Freeze, MD 05/29/22 2009

## 2022-05-29 NOTE — ED Triage Notes (Signed)
Patient here POV from Home.  Endorses Swelling to Left Leg that was noted approximately 1 Week ago. Some Tightness as well. No fevers.  No Recent Travel or Surgery. No Anticoagulants.   NAD Noted during Triage. A&Ox4. GCS 15. Ambulatory.

## 2022-09-28 ENCOUNTER — Encounter (HOSPITAL_COMMUNITY): Payer: Self-pay | Admitting: *Deleted

## 2022-09-28 ENCOUNTER — Emergency Department (HOSPITAL_COMMUNITY)
Admission: EM | Admit: 2022-09-28 | Discharge: 2022-09-28 | Disposition: A | Discharge status: Home / Self Care | Payer: Medicaid Other | Attending: Emergency Medicine | Admitting: Emergency Medicine

## 2022-09-28 ENCOUNTER — Other Ambulatory Visit: Payer: Self-pay

## 2022-09-28 DIAGNOSIS — M5442 Lumbago with sciatica, left side: Secondary | ICD-10-CM | POA: Insufficient documentation

## 2022-09-28 DIAGNOSIS — I10 Essential (primary) hypertension: Secondary | ICD-10-CM | POA: Insufficient documentation

## 2022-09-28 DIAGNOSIS — Z79899 Other long term (current) drug therapy: Secondary | ICD-10-CM | POA: Insufficient documentation

## 2022-09-28 DIAGNOSIS — E119 Type 2 diabetes mellitus without complications: Secondary | ICD-10-CM | POA: Insufficient documentation

## 2022-09-28 DIAGNOSIS — Z7984 Long term (current) use of oral hypoglycemic drugs: Secondary | ICD-10-CM | POA: Diagnosis not present

## 2022-09-28 DIAGNOSIS — M545 Low back pain, unspecified: Secondary | ICD-10-CM | POA: Diagnosis present

## 2022-09-28 MED ORDER — LIDOCAINE 5 % EX PTCH: 2.0000 | MEDICATED_PATCH | CUTANEOUS | 0 refills | Status: AC

## 2022-09-28 MED ORDER — LIDOCAINE 5 % EX PTCH: 2.0000 | MEDICATED_PATCH | CUTANEOUS | 0 refills | Status: DC

## 2022-09-28 MED ORDER — KETOROLAC TROMETHAMINE 15 MG/ML IJ SOLN
15.0000 mg | Freq: Once | INTRAMUSCULAR | Status: AC
  Administered 2022-09-28: 15 mg via INTRAMUSCULAR
  Filled 2022-09-28: qty 1

## 2022-09-28 MED ORDER — CYCLOBENZAPRINE HCL 10 MG PO TABS: 10.0000 mg | ORAL_TABLET | Freq: Three times a day (TID) | ORAL | 0 refills | Status: AC | PRN

## 2022-09-28 MED ORDER — CYCLOBENZAPRINE HCL 10 MG PO TABS
10.0000 mg | ORAL_TABLET | Freq: Once | ORAL | Status: AC
  Administered 2022-09-28: 10 mg via ORAL
  Filled 2022-09-28: qty 1

## 2022-09-28 MED ORDER — IBUPROFEN 600 MG PO TABS: 600.0000 mg | ORAL_TABLET | Freq: Three times a day (TID) | ORAL | 0 refills | Status: AC | PRN

## 2022-09-28 MED ORDER — OXYCODONE HCL 5 MG PO TABS
10.0000 mg | ORAL_TABLET | Freq: Once | ORAL | Status: AC
  Administered 2022-09-28: 10 mg via ORAL
  Filled 2022-09-28: qty 2

## 2022-09-28 MED ORDER — ACETAMINOPHEN 500 MG PO TABS
1000.0000 mg | ORAL_TABLET | Freq: Once | ORAL | Status: AC
  Administered 2022-09-28: 1000 mg via ORAL
  Filled 2022-09-28: qty 2

## 2022-09-28 NOTE — ED Triage Notes (Signed)
Pt in c/o L lower back pain that radiates to the L hip and L leg, onset x 1 wk worsening last night after moving and lifting items at home, pt A&O x4

## 2022-09-28 NOTE — ED Provider Notes (Signed)
[facility] Provider Note   CSN: [id number] Arrival date & time: 09/28/22  7425     History  Chief Complaint  Patient presents with   Back Pain    Jeff Day is a 57 y.o. male with past medical history of chronic back pain, type 2 diabetes, hyperlipidemia, hypertension who presents to the ED complaining of lower back pain that radiates to his left hip and down his left leg.  States that this began a week ago after moving furniture.  He took a break for a few days and then began moving again yesterday when pain worsens.  Notes history of lower back pain but never history of radiating pain like this.  States that is mostly pain but also does feel some numbness and tingling down the left leg.  He denies neck pain, fever, chills, abdominal pain, chest pain, shortness of breath, dysuria or hematuria, saddle anesthesia, bowel or bladder dysfunction, or other complaints.       Home Medications Prior to Admission medications   Medication Sig Start Date End Date Taking? Authorizing Provider  buPROPion (WELLBUTRIN XL) 150 MG 24 hr tablet Take 150 mg by mouth daily.   Yes [provider]  celecoxib (CELEBREX) 200 MG capsule Take 200 mg by mouth 2 (two) times daily.   Yes [provider]  cyclobenzaprine (FLEXERIL) 10 MG tablet Take 1 tablet (10 mg total) by mouth 3 (three) times daily as needed for up to 3 days for muscle spasms. 09/28/22 10/01/22 Yes Terre Zabriskie L, PA-C  escitalopram (LEXAPRO) 20 MG tablet Take 20 mg by mouth daily.   Yes [provider]  FARXIGA 10 MG TABS tablet Take 10 mg by mouth daily. 01/19/20  Yes [provider]  furosemide (LASIX) 20 MG tablet Take 20 mg by mouth daily as needed for fluid. 09/25/22  Yes [provider]  gabapentin (NEURONTIN) 400 MG capsule Take 800 mg by mouth 3 (three) times daily.   Yes [provider]  HUMULIN 70/30 KWIKPEN (70-30) 100 UNIT/ML KwikPen  Inject 38 Units into the skin 2 (two) times daily. 09/15/22  Yes [provider]  ibuprofen (ADVIL) 600 MG tablet Take 1 tablet (600 mg total) by mouth every 8 (eight) hours as needed for up to 3 days for mild pain or moderate pain. 09/28/22 10/01/22 Yes Emmani Lesueur L, PA-C  levocetirizine (XYZAL) 5 MG tablet Take 5 mg by mouth every evening.   Yes [provider]  lisinopril (ZESTRIL) 10 MG tablet Take 10 mg by mouth daily. 09/26/22  Yes [provider]  metFORMIN (GLUCOPHAGE) 1000 MG tablet Take 1,000 mg by mouth 2 (two) times daily with a meal.   Yes [provider]  montelukast (SINGULAIR) 10 MG tablet Take 10 mg by mouth daily.   Yes [provider]  omeprazole (PRILOSEC) 20 MG capsule Take 20 mg by mouth daily.   Yes [provider]  rosuvastatin (CRESTOR) 20 MG tablet Take 20 mg by mouth daily.   Yes [provider]  tiZANidine (ZANAFLEX) 4 MG tablet Take 4 mg by mouth every 8 (eight) hours as needed for muscle spasms. 09/25/22  Yes [provider]  traMADol (ULTRAM) 50 MG tablet Take 50 mg by mouth 2 (two) times daily as needed for moderate pain. 06/23/22  Yes [provider]  valACYclovir (VALTREX) 500 MG tablet Take 500 mg by mouth as needed.   Yes [provider]  ACCU-CHEK  AVIVA PLUS test strip USE AS DIRECTED 10/20/11   Jeff Barges, PA-C      Allergies    Patient has no known allergies.    Review of Systems   Review of Systems  All other systems reviewed and are negative.   Physical Exam Updated Vital Signs BP 127/64 (BP Location: Right Arm)   Pulse 64   Temp 97.8 F (36.6 C) (Oral)   Resp 19   Ht 5\' 11"  (1.803 m)   Wt 99.8 kg   SpO2 97%   BMI 30.68 kg/m  Physical Exam Vitals and nursing note reviewed.  Constitutional:      General: He is not in acute distress.    Appearance: Normal appearance. He is not ill-appearing, toxic-appearing or diaphoretic.  HENT:     Head: Normocephalic  and atraumatic.     Mouth/Throat:     Mouth: Mucous membranes are moist.  Eyes:     Conjunctiva/sclera: Conjunctivae normal.  Cardiovascular:     Rate and Rhythm: Normal rate and regular rhythm.     Heart sounds: No murmur heard. Pulmonary:     Effort: Pulmonary effort is normal.     Breath sounds: Normal breath sounds.  Abdominal:     General: Abdomen is flat.     Palpations: Abdomen is soft.     Tenderness: There is no abdominal tenderness. There is no guarding or rebound.  Musculoskeletal:     Cervical back: Normal range of motion and neck supple. No rigidity or tenderness.     Comments: No midline CTL spinal tenderness, step-offs, or deformities, moving all 4 extremities equally and spontaneously, moderate tenderness over the paraspinous muscles bilaterally of the lumbar region  Skin:    General: Skin is warm and dry.     Capillary Refill: Capillary refill takes less than 2 seconds.  Neurological:     General: No focal deficit present.     Mental Status: He is alert and oriented to person, place, and time.  Psychiatric:        Mood and Affect: Mood normal.        Behavior: Behavior normal.     ED Results / Procedures / Treatments   Labs (all labs ordered are listed, but only abnormal results are displayed) Labs Reviewed - No data to display  EKG None  Radiology No results found.  Procedures Procedures    Medications Ordered in ED Medications  ketorolac (TORADOL) 15 MG/ML injection 15 mg (has no administration in time range)  cyclobenzaprine (FLEXERIL) tablet 10 mg (has no administration in time range)  acetaminophen (TYLENOL) tablet 1,000 mg (has no administration in time range)  oxyCODONE (Oxy IR/ROXICODONE) immediate release tablet 10 mg (has no administration in time range)    ED Course/ Medical Decision Making/ A&P                             Medical Decision Making Risk OTC drugs. Prescription drug management.   Medical Decision Making:   Jeff Day is a 57 y.o. male who presented to the ED today with back pain detailed above.    Additional history discussed with patient's family/caregivers.  Patient's presentation is complicated by their history of chronic pain, HTN, HLD, lifting.  Complete initial physical exam performed, notably the patient  was in NAD. No midline tenderness, stepoffs, or deformities.  No meningismus.  Nontoxic-appearing.  Abdomen soft and nontender.  Nonfocal neuroexam. Reviewed and  confirmed nursing documentation for past medical history, family history, social history.    Initial Assessment:   With the patient's presentation of back pain, the emergent differential diagnosis for back pain includes but is not limited to fracture, muscle strain, cauda equina, spinal stenosis, DDD, ankylosing spondylitis, acute ligamentous injury, disk herniation, spondylolisthesis, epidural compression syndrome, metastatic cancer, transverse myelitis, vertebral osteomyelitis, diskitis, kidney stone, pyelonephritis, AAA, Perforated ulcer, retrocecal appendicitis, pancreatitis, bowel obstruction, retroperitoneal hemorrhage or mass, meningitis.   Initial Plan:  Symptomatic management Objective evaluation as reviewed   Final Assessment and Plan:   Patient presents to ED c/o back pain. No red flag symptoms. No history of malignancy or IV drug use. No severe trauma to the back.  Neurologically intact. Symptoms do seem consistent with musculoskeletal pain/sciatica. Will treat symptomatically and have follow up closely with PCP and/or ortho. Pt/partner expressed understanding of plan. Strict ED return precautions given, all questions answered, and stable for discharge.    Clinical Impression:  1. Acute left-sided low back pain with left-sided sciatica      Discharge           Final Clinical Impression(s) / ED Diagnoses Final diagnoses:  Acute left-sided low back pain with left-sided sciatica    Rx / DC Orders ED Discharge  Orders          Ordered    cyclobenzaprine (FLEXERIL) 10 MG tablet  3 times daily PRN        09/28/22 1041    ibuprofen (ADVIL) 600 MG tablet  Every 8 hours PRN        09/28/22 1041    lidocaine (LIDODERM) 5 %  Every 24 hours,   Status:  Discontinued        09/28/22 1041              Khylie Larmore, Lawrence Marseilles, PA-C 09/28/22 1052    Pricilla Loveless, MD 09/29/22 [phone]

## 2022-09-28 NOTE — Discharge Instructions (Addendum)
Thank you for letting us take care of you today.  Your symptoms are consistent with sciatica. We treat this symptomatically. I have prescribed medications to help at home including muscle relaxers. You may take over the counter Tylenol in addition to these medications. Please follow up with your PCP this week for re-evaluation. If you continue to have back pain or these symptoms, I recommend following up closely with your PCP or orthopedics. They may want to do additional imaging, refer you to physical therapy, or do other interventions to help with your symptoms. I provided exercises that can help rehabilitate your back at home. Do these as tolerated.   For new or worsening symptoms, return to nearest ED for re-evaluation.

## 2022-10-21 ENCOUNTER — Ambulatory Visit
Admission: EM | Admit: 2022-10-21 | Discharge: 2022-10-21 | Disposition: A | Discharge status: Home / Self Care | Payer: Medicaid Other

## 2022-10-21 DIAGNOSIS — M5432 Sciatica, left side: Secondary | ICD-10-CM | POA: Diagnosis not present

## 2022-10-21 MED ORDER — TRAMADOL HCL 50 MG PO TABS: 50.0000 mg | ORAL_TABLET | Freq: Two times a day (BID) | ORAL | 0 refills | Status: DC | PRN

## 2022-10-21 NOTE — ED Provider Notes (Signed)
[facility]    CSN: [id number] Arrival date & time: 10/21/22  1337      History   Chief Complaint No chief complaint on file.   HPI Jeff Day is a 57 y.o. male.   Presenting today with 1 month history of left lower back pain radiating down the left leg.  Has now been seen through his PCP, at the emergency department and at orthopedics and has been trying muscle relaxers and anti-inflammatory pain medication for the past month with no benefit.  Significant worsening pain with movement, especially after waking up in the morning.  Denies bowel or bladder incontinence, saddle anesthesias, fevers, chills, severe weakness in the leg.  Has an appointment scheduled with orthopedics for follow-up on 10/28/2022.    Past Medical History:  Diagnosis Date   Back pain    Depression with anxiety    Diabetes (HCC)    GERD (gastroesophageal reflux disease)    Headache    Hyperlipemia    Hypertension    Wears dentures     Patient Active Problem List   Diagnosis Date Noted   Intractable headache 11/24/2019    Past Surgical History:  Procedure Laterality Date   CARDIAC CATHETERIZATION     Normal   CARPAL TUNNEL RELEASE     COLONOSCOPY     HAND SURGERY Right    2006       Home Medications    Prior to Admission medications   Medication Sig Start Date End Date Taking? Authorizing Provider  diclofenac (VOLTAREN) 25 MG EC tablet Take 25 mg by mouth 2 (two) times daily.   Yes [provider]  traMADol (ULTRAM) 50 MG tablet Take 1 tablet (50 mg total) by mouth every 12 (twelve) hours as needed. 10/21/22  Yes Particia Nearing, PA-C  ACCU-CHEK AVIVA PLUS test strip USE AS DIRECTED 10/20/11   Sondra Barges, PA-C  buPROPion (WELLBUTRIN XL) 150 MG 24 hr tablet Take 150 mg by mouth daily.    [provider]  celecoxib (CELEBREX) 200 MG capsule Take 200 mg by mouth 2 (two) times daily.    [provider]  escitalopram (LEXAPRO) 20 MG tablet Take 20  mg by mouth daily.    [provider]  FARXIGA 10 MG TABS tablet Take 10 mg by mouth daily. 01/19/20   [provider]  furosemide (LASIX) 20 MG tablet Take 20 mg by mouth daily as needed for fluid. 09/25/22   [provider]  gabapentin (NEURONTIN) 400 MG capsule Take 800 mg by mouth 3 (three) times daily.    [provider]  HUMULIN 70/30 KWIKPEN (70-30) 100 UNIT/ML KwikPen Inject 38 Units into the skin 2 (two) times daily. 09/15/22   [provider]  levocetirizine (XYZAL) 5 MG tablet Take 5 mg by mouth every evening.    [provider]  lisinopril (ZESTRIL) 10 MG tablet Take 10 mg by mouth daily. 09/26/22   [provider]  metFORMIN (GLUCOPHAGE) 1000 MG tablet Take 1,000 mg by mouth 2 (two) times daily with a meal.    [provider]  montelukast (SINGULAIR) 10 MG tablet Take 10 mg by mouth daily.    [provider]  omeprazole (PRILOSEC) 20 MG capsule Take 20 mg by mouth daily.    [provider]  rosuvastatin (CRESTOR) 20 MG tablet Take 20 mg by mouth daily.    [provider]  tiZANidine (ZANAFLEX) 4 MG tablet Take 4 mg by mouth every  8 (eight) hours as needed for muscle spasms. 09/25/22   [provider]  traMADol (ULTRAM) 50 MG tablet Take 50 mg by mouth 2 (two) times daily as needed for moderate pain. 06/23/22   [provider]  valACYclovir (VALTREX) 500 MG tablet Take 500 mg by mouth as needed.    [provider]    Family History Family History  Problem Relation Age of Onset   Obesity Mother    Other Mother        meningitis   Cirrhosis Father    Alcoholism Father    Diabetes Sister    Diabetes Paternal Grandfather    Diabetes Maternal Grandfather    Suicidality Brother     Social History Social History   Tobacco Use   Smoking status: Former   Smokeless tobacco: Former   Tobacco comments:    Quit 2001  Building services engineer Use: Every day    Substances: Nicotine  Substance Use Topics   Alcohol use: Yes    Alcohol/week: 0.0 standard drinks of alcohol    Comment: 4 per month   Drug use: No     Allergies   Patient has no known allergies.   Review of Systems Review of Systems Per HPI  Physical Exam Triage Vital Signs ED Triage Vitals  Enc Vitals Group     BP 10/21/22 1418 125/77     Pulse Rate 10/21/22 1418 89     Resp 10/21/22 1418 18     Temp 10/21/22 1418 97.8 F (36.6 C)     Temp Source 10/21/22 1418 Oral     SpO2 10/21/22 1418 98 %     Weight --      Height --      Head Circumference --      Peak Flow --      Pain Score 10/21/22 1419 6     Pain Loc --      Pain Edu? --      Excl. in GC? --    No data found.  Updated Vital Signs BP 125/77 (BP Location: Right Arm)   Pulse 89   Temp 97.8 F (36.6 C) (Oral)   Resp 18   SpO2 98%   Visual Acuity Right Eye Distance:   Left Eye Distance:   Bilateral Distance:    Right Eye Near:   Left Eye Near:    Bilateral Near:     Physical Exam Vitals and nursing note reviewed.  Constitutional:      Appearance: Normal appearance.  HENT:     Head: Atraumatic.     Mouth/Throat:     Mouth: Mucous membranes are moist.  Eyes:     Extraocular Movements: Extraocular movements intact.     Conjunctiva/sclera: Conjunctivae normal.  Cardiovascular:     Rate and Rhythm: Normal rate and regular rhythm.  Pulmonary:     Effort: Pulmonary effort is normal.     Breath sounds: Normal breath sounds.  Abdominal:     General: Bowel sounds are normal. There is no distension.     Palpations: Abdomen is soft.     Tenderness: There is no abdominal tenderness. There is no right CVA tenderness, left CVA tenderness or guarding.  Musculoskeletal:        General: Normal range of motion.     Cervical back: Normal range of motion and neck supple.     Comments: No midline spinal tenderness to palpation diffusely.  Negative straight leg raise bilaterally.  Normal gait and range  of motion  Skin:    General: Skin is warm and dry.  Neurological:     General: No focal deficit present.     Mental Status: He is oriented to person, place, and time.     Motor: No weakness.     Gait: Gait normal.     Comments: Bilateral lower extremities neurovascularly intact  Psychiatric:        Mood and Affect: Mood normal.        Thought Content: Thought content normal.        Judgment: Judgment normal.      UC Treatments / Results  Labs (all labs ordered are listed, but only abnormal results are displayed) Labs Reviewed - No data to display  EKG   Radiology No results found.  Procedures Procedures (including critical care time)  Medications Ordered in UC Medications - No data to display  Initial Impression / Assessment and Plan / UC Course  I have reviewed the triage vital signs and the nursing notes.  Pertinent labs & imaging results that were available during my care of the patient were reviewed by me and considered in my medical decision making (see chart for details).     Vitals and exam overall reassuring today with no red flag findings.  Already taking gabapentin, Flexeril, anti-inflammatory pain medications.  Will add a small supply of tramadol for severe pain episodes while awaiting orthopedic follow-up next week.  Return for any worsening symptoms at any time.  Final Clinical Impressions(s) / UC Diagnoses   Final diagnoses:  Left sided sciatica     Discharge Instructions      Follow up as scheduled with Orthopedics for further recommendations.Continue your current regimen, and I have sent a few tablets of pain medication for severe episodes    ED Prescriptions     Medication Sig Dispense Auth. Provider   traMADol (ULTRAM) 50 MG tablet Take 1 tablet (50 mg total) by mouth every 12 (twelve) hours as needed. 10 tablet Particia Nearing, [place]      I have reviewed the PDMP during this encounter.   Particia Nearing, [place] 10/21/22  1519

## 2022-10-21 NOTE — ED Triage Notes (Signed)
Left lower back and leg pain x 1 month.  States it's hard to walk now.  Has seen his PCP and has been to the ED and diagnosed with sciatica.  States pain medication he has been taking has not been helping.

## 2022-10-21 NOTE — Discharge Instructions (Signed)
Follow up as scheduled with Orthopedics for further recommendations.Continue your current regimen, and I have sent a few tablets of pain medication for severe episodes

## 2022-10-26 LAB — LIPID PANEL
LDL Cholesterol: 29
Triglycerides: 323 — AB (ref 40–160)

## 2022-10-26 LAB — BASIC METABOLIC PANEL
BUN: 21 (ref 4–21)
Creatinine: 1.3 (ref 0.6–1.3)
Glucose: 424

## 2022-10-26 LAB — COMPREHENSIVE METABOLIC PANEL: eGFR: 63

## 2022-10-26 LAB — HEMOGLOBIN A1C: Hemoglobin A1C: 13.3

## 2022-11-05 ENCOUNTER — Encounter (HOSPITAL_COMMUNITY): Payer: Self-pay | Admitting: Emergency Medicine

## 2022-11-05 ENCOUNTER — Other Ambulatory Visit: Payer: Self-pay

## 2022-11-05 ENCOUNTER — Emergency Department (HOSPITAL_COMMUNITY)
Admission: EM | Admit: 2022-11-05 | Discharge: 2022-11-05 | Disposition: A | Discharge status: Home / Self Care | Payer: Medicaid Other | Attending: Emergency Medicine | Admitting: Emergency Medicine

## 2022-11-05 DIAGNOSIS — M5430 Sciatica, unspecified side: Secondary | ICD-10-CM

## 2022-11-05 DIAGNOSIS — Z7984 Long term (current) use of oral hypoglycemic drugs: Secondary | ICD-10-CM | POA: Diagnosis not present

## 2022-11-05 DIAGNOSIS — R109 Unspecified abdominal pain: Secondary | ICD-10-CM | POA: Insufficient documentation

## 2022-11-05 DIAGNOSIS — M545 Low back pain, unspecified: Secondary | ICD-10-CM | POA: Diagnosis present

## 2022-11-05 DIAGNOSIS — Z79899 Other long term (current) drug therapy: Secondary | ICD-10-CM | POA: Insufficient documentation

## 2022-11-05 DIAGNOSIS — R112 Nausea with vomiting, unspecified: Secondary | ICD-10-CM | POA: Diagnosis not present

## 2022-11-05 DIAGNOSIS — E119 Type 2 diabetes mellitus without complications: Secondary | ICD-10-CM | POA: Diagnosis not present

## 2022-11-05 DIAGNOSIS — M5442 Lumbago with sciatica, left side: Secondary | ICD-10-CM | POA: Insufficient documentation

## 2022-11-05 DIAGNOSIS — I10 Essential (primary) hypertension: Secondary | ICD-10-CM | POA: Insufficient documentation

## 2022-11-05 LAB — URINALYSIS, ROUTINE W REFLEX MICROSCOPIC
Bilirubin Urine: NEGATIVE
Glucose, UA: >=500 mg/dL — AB
Hgb urine dipstick: NEGATIVE
Ketones, ur: 20 mg/dL — AB
Leukocytes,Ua: NEGATIVE
Nitrite: NEGATIVE
Protein, ur: 30 mg/dL — AB
Specific Gravity, Urine: 1.028 (ref 1.005–1.030)
pH: 7 (ref 5.0–8.0)

## 2022-11-05 LAB — COMPREHENSIVE METABOLIC PANEL
ALT: 29 U/L (ref 0–44)
AST: 18 U/L (ref 15–41)
Albumin: 3.6 g/dL (ref 3.5–5.0)
Alkaline Phosphatase: 56 U/L (ref 38–126)
Anion gap: 11 (ref 5–15)
BUN: 30 mg/dL — ABNORMAL HIGH (ref 6–20)
CO2: 25 mmol/L (ref 22–32)
Calcium: 9.1 mg/dL (ref 8.9–10.3)
Chloride: 101 mmol/L (ref 98–111)
Creatinine, Ser: 1.17 mg/dL (ref 0.61–1.24)
GFR, Estimated: >60 mL/min (ref >60)
Glucose, Bld: 220 mg/dL — ABNORMAL HIGH (ref 70–99)
Potassium: 4.8 mmol/L (ref 3.5–5.1)
Sodium: 137 mmol/L (ref 135–145)
Total Bilirubin: 1.1 mg/dL (ref 0.3–1.2)
Total Protein: 7.1 g/dL (ref 6.5–8.1)

## 2022-11-05 LAB — CBC WITH DIFFERENTIAL/PLATELET
Abs Immature Granulocytes: 0.05 10*3/uL (ref 0.00–0.07)
Basophils Absolute: 0.1 10*3/uL (ref 0.0–0.1)
Basophils Relative: 1 %
Eosinophils Absolute: 0.2 10*3/uL (ref 0.0–0.5)
Eosinophils Relative: 2 %
HCT: 40 % (ref 39.0–52.0)
Hemoglobin: 12.9 g/dL — ABNORMAL LOW (ref 13.0–17.0)
Immature Granulocytes: 1 %
Lymphocytes Relative: 10 %
Lymphs Abs: 0.8 10*3/uL (ref 0.7–4.0)
MCH: 29 pg (ref 26.0–34.0)
MCHC: 32.3 g/dL (ref 30.0–36.0)
MCV: 89.9 fL (ref 80.0–100.0)
Monocytes Absolute: 0.5 10*3/uL (ref 0.1–1.0)
Monocytes Relative: 6 %
Neutro Abs: 6.3 10*3/uL (ref 1.7–7.7)
Neutrophils Relative %: 80 %
Platelets: 244 10*3/uL (ref 150–400)
RBC: 4.45 MIL/uL (ref 4.22–5.81)
RDW: 16.8 % — ABNORMAL HIGH (ref 11.5–15.5)
WBC: 7.8 10*3/uL (ref 4.0–10.5)
nRBC: 0 % (ref 0.0–0.2)

## 2022-11-05 LAB — LIPASE, BLOOD: Lipase: 33 U/L (ref 11–51)

## 2022-11-05 LAB — CBG MONITORING, ED: Glucose-Capillary: 203 mg/dL — ABNORMAL HIGH (ref 70–99)

## 2022-11-05 MED ORDER — SODIUM CHLORIDE 0.9 % IV SOLN: INTRAVENOUS | Status: DC

## 2022-11-05 MED ORDER — SODIUM CHLORIDE 0.9 % IV BOLUS
1000.0000 mL | Freq: Once | INTRAVENOUS | Status: AC
  Administered 2022-11-05: 1000 mL via INTRAVENOUS

## 2022-11-05 MED ORDER — MORPHINE SULFATE (PF) 4 MG/ML IV SOLN
4.0000 mg | Freq: Once | INTRAVENOUS | Status: AC
  Administered 2022-11-05: 4 mg via INTRAVENOUS
  Filled 2022-11-05: qty 1

## 2022-11-05 MED ORDER — ONDANSETRON HCL 4 MG/2ML IJ SOLN
4.0000 mg | Freq: Once | INTRAMUSCULAR | Status: AC
  Administered 2022-11-05: 4 mg via INTRAVENOUS
  Filled 2022-11-05: qty 2

## 2022-11-05 NOTE — ED Notes (Signed)
Urinal at bedside.  

## 2022-11-05 NOTE — ED Notes (Signed)
Upon entering room noted pt diaphoretic and with full emesis bag. Endorses upper abd pain, low back pain, N/V, LLE wound. EKG performed and given to EDP.

## 2022-11-05 NOTE — ED Provider Notes (Signed)
[place] EM[facility]ENC[facility] AT [facility] Provider Note   CSN: [id number] Arrival date & time: 11/05/22  2046     History  Chief Complaint  Patient presents with   Wound Check   Abdominal Pain    OSHA Day is a 57 y.o. male.   Wound Check Associated symptoms include abdominal pain.  Abdominal Pain  Patient has history of diabetes hypertension hyperlipidemia reflux and sciatica.  Patient has been having low back pain with sciatica down his left side.  Patient has been seen in the emergency room as well as an urgent care.  He follow-up with his doctor and is now getting physical therapy.  Patient states he had physical therapy today and was having some more pain and discomfort in his back.  While he was home today he started to feel very diaphoretic.  He experienced some nausea and vomiting as well as pain in his abdomen.  Although symptoms have now resolved.  He is not having any diaphoresis.  He denies any abdominal pain.  He denies any numbness or weakness.  No chest pain or shortness of breath.  Patient also has a superficial wound on his lower leg.  He has had some drainage of fluid from that but no redness extending up his leg    Home Medications Prior to Admission medications   Medication Sig Start Date End Date Taking? Authorizing Provider  ACCU-CHEK AVIVA PLUS test strip USE AS DIRECTED 10/20/11   Sondra Barges, PA-C  buPROPion (WELLBUTRIN XL) 150 MG 24 hr tablet Take 150 mg by mouth daily.    [provider]  celecoxib (CELEBREX) 200 MG capsule Take 200 mg by mouth 2 (two) times daily.    [provider]  diclofenac (VOLTAREN) 25 MG EC tablet Take 25 mg by mouth 2 (two) times daily.    [provider]  escitalopram (LEXAPRO) 20 MG tablet Take 20 mg by mouth daily.    [provider]  FARXIGA 10 MG TABS tablet Take 10 mg by mouth daily. 01/19/20   [provider]  furosemide (LASIX) 20 MG tablet Take 20 mg by mouth  daily as needed for fluid. 09/25/22   [provider]  gabapentin (NEURONTIN) 400 MG capsule Take 800 mg by mouth 3 (three) times daily.    [provider]  HUMULIN 70/30 KWIKPEN (70-30) 100 UNIT/ML KwikPen Inject 38 Units into the skin 2 (two) times daily. 09/15/22   [provider]  levocetirizine (XYZAL) 5 MG tablet Take 5 mg by mouth every evening.    [provider]  lisinopril (ZESTRIL) 10 MG tablet Take 10 mg by mouth daily. 09/26/22   [provider]  metFORMIN (GLUCOPHAGE) 1000 MG tablet Take 1,000 mg by mouth 2 (two) times daily with a meal.    [provider]  montelukast (SINGULAIR) 10 MG tablet Take 10 mg by mouth daily.    [provider]  omeprazole (PRILOSEC) 20 MG capsule Take 20 mg by mouth daily.    [provider]  rosuvastatin (CRESTOR) 20 MG tablet Take 20 mg by mouth daily.    [provider]  tiZANidine (ZANAFLEX) 4 MG tablet Take 4 mg by mouth every 8 (eight) hours as needed for muscle spasms. 09/25/22   [provider]  traMADol (ULTRAM) 50 MG tablet Take 50 mg by mouth 2 (two) times daily as needed for moderate pain. 06/23/22   [provider]  traMADol (ULTRAM) 50 MG tablet  Take 1 tablet (50 mg total) by mouth every 12 (twelve) hours as needed. 10/21/22   Particia Nearing, PA-C  valACYclovir (VALTREX) 500 MG tablet Take 500 mg by mouth as needed.    [provider]      Allergies    Patient has no known allergies.    Review of Systems   Review of Systems  Gastrointestinal:  Positive for abdominal pain.    Physical Exam Updated Vital Signs BP (!) 146/75   Pulse (!) 56   Temp 97.9 F (36.6 C) (Oral)   Resp (!) 22   Wt 99.8 kg   SpO2 100%   BMI 30.69 kg/m  Physical Exam Vitals and nursing note reviewed.  Constitutional:      General: He is not in acute distress.    Appearance: He is well-developed.  HENT:     Head: Normocephalic and atraumatic.      Right Ear: External ear normal.     Left Ear: External ear normal.  Eyes:     General: No scleral icterus.       Right eye: No discharge.        Left eye: No discharge.     Conjunctiva/sclera: Conjunctivae normal.  Neck:     Trachea: No tracheal deviation.  Cardiovascular:     Rate and Rhythm: Normal rate and regular rhythm.  Pulmonary:     Effort: Pulmonary effort is normal. No respiratory distress.     Breath sounds: Normal breath sounds. No stridor. No wheezing or rales.  Abdominal:     General: Bowel sounds are normal. There is no distension.     Palpations: Abdomen is soft.     Tenderness: There is no abdominal tenderness. There is no guarding or rebound.  Musculoskeletal:        General: No tenderness or deformity.     Cervical back: Neck supple.  Skin:    General: Skin is warm and dry.     Findings: No rash.     Comments: Facial abrasion on the shin, no lymphatic streaking, no purulent drainage  Neurological:     General: No focal deficit present.     Mental Status: He is alert.     Cranial Nerves: No cranial nerve deficit, dysarthria or facial asymmetry.     Sensory: No sensory deficit.     Motor: No abnormal muscle tone or seizure activity.     Coordination: Coordination normal.     Comments: Normal strength and sensation bilateral lower extremities  Psychiatric:        Mood and Affect: Mood normal.     ED Results / Procedures / Treatments   Labs (all labs ordered are listed, but only abnormal results are displayed) Labs Reviewed  COMPREHENSIVE METABOLIC PANEL - Abnormal; Notable for the following components:      Result Value   Glucose, Bld 220 (*)    BUN 30 (*)    All other components within normal limits  CBC WITH DIFFERENTIAL/PLATELET - Abnormal; Notable for the following components:   Hemoglobin 12.9 (*)    RDW 16.8 (*)    All other components within normal limits  URINALYSIS, ROUTINE W REFLEX MICROSCOPIC - Abnormal; Notable for the following  components:   Glucose, UA >=500 (*)    Ketones, ur 20 (*)    Protein, ur 30 (*)    Bacteria, UA RARE (*)    All other components within normal limits  CBG MONITORING, ED - Abnormal; Notable for  the following components:   Glucose-Capillary 203 (*)    All other components within normal limits  LIPASE, BLOOD    EKG EKG Interpretation Date/Time:  Thursday November 05 2022 21:20:33 EDT Ventricular Rate:  53 PR Interval:  134 QRS Duration:  105 QT Interval:  487 QTC Calculation: 458 R Axis:   32  Text Interpretation: Sinus rhythm Confirmed by Linwood Dibbles [phone]) on 11/05/2022 9:23:22 PM  Radiology No results found.  Procedures Procedures    Medications Ordered in ED Medications  sodium chloride 0.9 % bolus 1,000 mL (1,000 mLs Intravenous New Bag/Given 11/05/22 2153)    And  0.9 %  sodium chloride infusion (0 mLs Intravenous Hold 11/05/22 2154)  morphine (PF) 4 MG/ML injection 4 mg (4 mg Intravenous Given 11/05/22 2154)  ondansetron (ZOFRAN) injection 4 mg (4 mg Intravenous Given 11/05/22 2154)    ED Course/ Medical Decision Making/ A&P Clinical Course as of 11/05/22 2317  Thu Nov 05, 2022  2256 Urinalysis, Routine w reflex microscopic -Urine, Clean Catch(!) Urinalysis without signs of infection.  CBC normal.  Metabolic panel normal.  Lipase normal. [JK]    Clinical Course User Index [JK] Linwood Dibbles, MD                             Medical Decision Making Problems Addressed: Abdominal pain, unspecified abdominal location: acute illness or injury that poses a threat to life or bodily functions Sciatica, unspecified laterality: acute illness or injury that poses a threat to life or bodily functions  Amount and/or Complexity of Data Reviewed Labs: ordered. Decision-making details documented in ED Course.  Risk Prescription drug management.   She has history of sciatica.  Patient has been having increasing difficulty that recently.  No acute neurodeficits.  Normal strength  and sensation.  Patient was treated with pain medication in the ED with improvement.  He did have episode of nausea earlier.  He does not have any abdominal tenderness.  No signs of hepatitis or pancreatitis.  Urinalysis does not show findings suggestive of infection.  No hematuria to suggest kidney stone.  Patient is comfortable in the ED.  I have low suspicion for appendicitis or acute surgical condition.  Do not feel CT scan is necessary at this time.  Patient states he has medications at home for pain.  Evaluation and diagnostic testing in the emergency department does not suggest an emergent condition requiring admission or immediate intervention beyond what has been performed at this time.  The patient is safe for discharge and has been instructed to return immediately for worsening symptoms, change in symptoms or any other concerns.       Final Clinical Impression(s) / ED Diagnoses Final diagnoses:  Sciatica, unspecified laterality  Abdominal pain, unspecified abdominal location    Rx / DC Orders ED Discharge Orders     None         Linwood Dibbles, MD 11/05/22 2318

## 2022-11-05 NOTE — Discharge Instructions (Signed)
The test today in the ED were reassuring.  Continue your current medications.  Follow-up with your spine doctor as planned for further treatment and evaluation.  Return to the ER for fever vomiting or other concerning symptoms

## 2022-11-05 NOTE — ED Triage Notes (Addendum)
Pt in with ongoing pain caused by sciatica to LLE, pain worsened today. Pt also states he is out of pain meds. Diaphoretic and nauseous in triage. Pt also has reported open, covered wound to L shin that has been worsening, hx of DM

## 2022-11-07 ENCOUNTER — Emergency Department (HOSPITAL_COMMUNITY): Payer: Medicaid Other

## 2022-11-07 ENCOUNTER — Encounter (HOSPITAL_COMMUNITY): Payer: Self-pay

## 2022-11-07 ENCOUNTER — Inpatient Hospital Stay (HOSPITAL_COMMUNITY)
Admission: EM | Admit: 2022-11-07 | Discharge: 2022-11-10 | DRG: 637 | Disposition: A | Discharge status: Home / Self Care | Payer: Medicaid Other | Attending: Internal Medicine | Admitting: Internal Medicine

## 2022-11-07 ENCOUNTER — Other Ambulatory Visit: Payer: Self-pay

## 2022-11-07 DIAGNOSIS — I10 Essential (primary) hypertension: Secondary | ICD-10-CM | POA: Insufficient documentation

## 2022-11-07 DIAGNOSIS — E8729 Other acidosis: Secondary | ICD-10-CM | POA: Diagnosis not present

## 2022-11-07 DIAGNOSIS — Z791 Long term (current) use of non-steroidal anti-inflammatories (NSAID): Secondary | ICD-10-CM | POA: Diagnosis not present

## 2022-11-07 DIAGNOSIS — Z91148 Patient's other noncompliance with medication regimen for other reason: Secondary | ICD-10-CM | POA: Diagnosis not present

## 2022-11-07 DIAGNOSIS — E782 Mixed hyperlipidemia: Secondary | ICD-10-CM

## 2022-11-07 DIAGNOSIS — Z7984 Long term (current) use of oral hypoglycemic drugs: Secondary | ICD-10-CM | POA: Diagnosis not present

## 2022-11-07 DIAGNOSIS — R109 Unspecified abdominal pain: Principal | ICD-10-CM

## 2022-11-07 DIAGNOSIS — Z833 Family history of diabetes mellitus: Secondary | ICD-10-CM

## 2022-11-07 DIAGNOSIS — R451 Restlessness and agitation: Secondary | ICD-10-CM | POA: Diagnosis present

## 2022-11-07 DIAGNOSIS — F32A Depression, unspecified: Secondary | ICD-10-CM | POA: Diagnosis present

## 2022-11-07 DIAGNOSIS — Z79899 Other long term (current) drug therapy: Secondary | ICD-10-CM | POA: Diagnosis not present

## 2022-11-07 DIAGNOSIS — F419 Anxiety disorder, unspecified: Secondary | ICD-10-CM | POA: Diagnosis present

## 2022-11-07 DIAGNOSIS — Z811 Family history of alcohol abuse and dependence: Secondary | ICD-10-CM | POA: Diagnosis not present

## 2022-11-07 DIAGNOSIS — M545 Low back pain, unspecified: Secondary | ICD-10-CM

## 2022-11-07 DIAGNOSIS — K219 Gastro-esophageal reflux disease without esophagitis: Secondary | ICD-10-CM | POA: Diagnosis present

## 2022-11-07 DIAGNOSIS — R4182 Altered mental status, unspecified: Secondary | ICD-10-CM | POA: Diagnosis present

## 2022-11-07 DIAGNOSIS — E785 Hyperlipidemia, unspecified: Secondary | ICD-10-CM | POA: Diagnosis present

## 2022-11-07 DIAGNOSIS — M79605 Pain in left leg: Secondary | ICD-10-CM | POA: Diagnosis not present

## 2022-11-07 DIAGNOSIS — F17290 Nicotine dependence, other tobacco product, uncomplicated: Secondary | ICD-10-CM | POA: Diagnosis present

## 2022-11-07 DIAGNOSIS — R410 Disorientation, unspecified: Secondary | ICD-10-CM | POA: Diagnosis not present

## 2022-11-07 DIAGNOSIS — E1165 Type 2 diabetes mellitus with hyperglycemia: Secondary | ICD-10-CM | POA: Diagnosis not present

## 2022-11-07 DIAGNOSIS — E119 Type 2 diabetes mellitus without complications: Secondary | ICD-10-CM

## 2022-11-07 DIAGNOSIS — Z794 Long term (current) use of insulin: Secondary | ICD-10-CM | POA: Diagnosis not present

## 2022-11-07 DIAGNOSIS — G9341 Metabolic encephalopathy: Secondary | ICD-10-CM | POA: Diagnosis present

## 2022-11-07 DIAGNOSIS — Z91118 Patient's noncompliance with dietary regimen for other reason: Secondary | ICD-10-CM | POA: Diagnosis not present

## 2022-11-07 DIAGNOSIS — E1110 Type 2 diabetes mellitus with ketoacidosis without coma: Secondary | ICD-10-CM | POA: Diagnosis present

## 2022-11-07 LAB — GLUCOSE, CAPILLARY
Glucose-Capillary: 129 mg/dL — ABNORMAL HIGH (ref 70–99)
Glucose-Capillary: 137 mg/dL — ABNORMAL HIGH (ref 70–99)
Glucose-Capillary: 154 mg/dL — ABNORMAL HIGH (ref 70–99)
Glucose-Capillary: 156 mg/dL — ABNORMAL HIGH (ref 70–99)
Glucose-Capillary: 159 mg/dL — ABNORMAL HIGH (ref 70–99)
Glucose-Capillary: 163 mg/dL — ABNORMAL HIGH (ref 70–99)
Glucose-Capillary: 171 mg/dL — ABNORMAL HIGH (ref 70–99)
Glucose-Capillary: 178 mg/dL — ABNORMAL HIGH (ref 70–99)
Glucose-Capillary: 182 mg/dL — ABNORMAL HIGH (ref 70–99)
Glucose-Capillary: 183 mg/dL — ABNORMAL HIGH (ref 70–99)
Glucose-Capillary: 227 mg/dL — ABNORMAL HIGH (ref 70–99)

## 2022-11-07 LAB — PHOSPHORUS: Phosphorus: 2.9 mg/dL (ref 2.5–4.6)

## 2022-11-07 LAB — BASIC METABOLIC PANEL
Anion gap: 8 (ref 5–15)
Anion gap: 10 (ref 5–15)
Anion gap: 6 (ref 5–15)
BUN: 21 mg/dL — ABNORMAL HIGH (ref 6–20)
BUN: 20 mg/dL (ref 6–20)
BUN: 19 mg/dL (ref 6–20)
CO2: 21 mmol/L — ABNORMAL LOW (ref 22–32)
CO2: 20 mmol/L — ABNORMAL LOW (ref 22–32)
CO2: 23 mmol/L (ref 22–32)
Calcium: 8.7 mg/dL — ABNORMAL LOW (ref 8.9–10.3)
Calcium: 9 mg/dL (ref 8.9–10.3)
Calcium: 8.8 mg/dL — ABNORMAL LOW (ref 8.9–10.3)
Chloride: 108 mmol/L (ref 98–111)
Chloride: 109 mmol/L (ref 98–111)
Chloride: 108 mmol/L (ref 98–111)
Creatinine, Ser: 0.98 mg/dL (ref 0.61–1.24)
Creatinine, Ser: 0.94 mg/dL (ref 0.61–1.24)
Creatinine, Ser: 0.79 mg/dL (ref 0.61–1.24)
GFR, Estimated: >60 mL/min (ref >60)
GFR, Estimated: >60 mL/min (ref >60)
GFR, Estimated: >60 mL/min (ref >60)
Glucose, Bld: 168 mg/dL — ABNORMAL HIGH (ref 70–99)
Glucose, Bld: 184 mg/dL — ABNORMAL HIGH (ref 70–99)
Glucose, Bld: 156 mg/dL — ABNORMAL HIGH (ref 70–99)
Potassium: 4.4 mmol/L (ref 3.5–5.1)
Potassium: 4.4 mmol/L (ref 3.5–5.1)
Potassium: 3.9 mmol/L (ref 3.5–5.1)
Sodium: 137 mmol/L (ref 135–145)
Sodium: 139 mmol/L (ref 135–145)
Sodium: 137 mmol/L (ref 135–145)

## 2022-11-07 LAB — URINALYSIS, W/ REFLEX TO CULTURE (INFECTION SUSPECTED)
Bacteria, UA: NONE SEEN
Bilirubin Urine: NEGATIVE
Glucose, UA: >=500 mg/dL — AB
Hgb urine dipstick: NEGATIVE
Ketones, ur: 5 mg/dL — AB
Leukocytes,Ua: NEGATIVE
Nitrite: NEGATIVE
Protein, ur: NEGATIVE mg/dL
Specific Gravity, Urine: 1.029 (ref 1.005–1.030)
pH: 5 (ref 5.0–8.0)

## 2022-11-07 LAB — URINALYSIS, ROUTINE W REFLEX MICROSCOPIC
Bacteria, UA: NONE SEEN
Bilirubin Urine: NEGATIVE
Glucose, UA: >=500 mg/dL — AB
Hgb urine dipstick: NEGATIVE
Ketones, ur: 20 mg/dL — AB
Leukocytes,Ua: NEGATIVE
Nitrite: NEGATIVE
Protein, ur: NEGATIVE mg/dL
Specific Gravity, Urine: 1.042 — ABNORMAL HIGH (ref 1.005–1.030)
pH: 5 (ref 5.0–8.0)

## 2022-11-07 LAB — MRSA NEXT GEN BY PCR, NASAL: MRSA by PCR Next Gen: NOT DETECTED

## 2022-11-07 LAB — RAPID URINE DRUG SCREEN, HOSP PERFORMED
Amphetamines: NOT DETECTED
Barbiturates: NOT DETECTED
Benzodiazepines: NOT DETECTED
Cocaine: NOT DETECTED
Opiates: POSITIVE — AB
Tetrahydrocannabinol: POSITIVE — AB

## 2022-11-07 LAB — COMPREHENSIVE METABOLIC PANEL
ALT: 28 U/L (ref 0–44)
AST: 20 U/L (ref 15–41)
Albumin: 4 g/dL (ref 3.5–5.0)
Alkaline Phosphatase: 58 U/L (ref 38–126)
Anion gap: 16 — ABNORMAL HIGH (ref 5–15)
BUN: 22 mg/dL — ABNORMAL HIGH (ref 6–20)
CO2: 16 mmol/L — ABNORMAL LOW (ref 22–32)
Calcium: 9 mg/dL (ref 8.9–10.3)
Chloride: 102 mmol/L (ref 98–111)
Creatinine, Ser: 1.1 mg/dL (ref 0.61–1.24)
GFR, Estimated: >60 mL/min (ref >60)
Glucose, Bld: 269 mg/dL — ABNORMAL HIGH (ref 70–99)
Potassium: 5.2 mmol/L — ABNORMAL HIGH (ref 3.5–5.1)
Sodium: 134 mmol/L — ABNORMAL LOW (ref 135–145)
Total Bilirubin: 1.5 mg/dL — ABNORMAL HIGH (ref 0.3–1.2)
Total Protein: 7.6 g/dL (ref 6.5–8.1)

## 2022-11-07 LAB — BLOOD GAS, VENOUS
Acid-base deficit: 5.3 mmol/L — ABNORMAL HIGH (ref 0.0–2.0)
Bicarbonate: 22 mmol/L (ref 20.0–28.0)
Drawn by: 27160
O2 Saturation: 47.9 %
Patient temperature: 36.7
pCO2, Ven: 48 mmHg (ref 44–60)
pH, Ven: 7.26 (ref 7.25–7.43)
pO2, Ven: <31 mmHg — CL (ref 32–45)

## 2022-11-07 LAB — TROPONIN I (HIGH SENSITIVITY)
Troponin I (High Sensitivity): 3 ng/L (ref <18)
Troponin I (High Sensitivity): 4 ng/L (ref <18)

## 2022-11-07 LAB — CBC
HCT: 41.5 % (ref 39.0–52.0)
Hemoglobin: 13.2 g/dL (ref 13.0–17.0)
MCH: 28.8 pg (ref 26.0–34.0)
MCHC: 31.8 g/dL (ref 30.0–36.0)
MCV: 90.6 fL (ref 80.0–100.0)
Platelets: 290 10*3/uL (ref 150–400)
RBC: 4.58 MIL/uL (ref 4.22–5.81)
RDW: 16.9 % — ABNORMAL HIGH (ref 11.5–15.5)
WBC: 7.9 10*3/uL (ref 4.0–10.5)
nRBC: 0 % (ref 0.0–0.2)

## 2022-11-07 LAB — CBG MONITORING, ED
Glucose-Capillary: 186 mg/dL — ABNORMAL HIGH (ref 70–99)
Glucose-Capillary: 243 mg/dL — ABNORMAL HIGH (ref 70–99)

## 2022-11-07 LAB — MAGNESIUM: Magnesium: 1.9 mg/dL (ref 1.7–2.4)

## 2022-11-07 LAB — BETA-HYDROXYBUTYRIC ACID
Beta-Hydroxybutyric Acid: 4.36 mmol/L — ABNORMAL HIGH (ref 0.05–0.27)
Beta-Hydroxybutyric Acid: 2.88 mmol/L — ABNORMAL HIGH (ref 0.05–0.27)
Beta-Hydroxybutyric Acid: 0.46 mmol/L — ABNORMAL HIGH (ref 0.05–0.27)

## 2022-11-07 LAB — ETHANOL: Alcohol, Ethyl (B): <10 mg/dL (ref <10)

## 2022-11-07 LAB — HEMOGLOBIN A1C
Hgb A1c MFr Bld: 12.9 % — ABNORMAL HIGH (ref 4.8–5.6)
Mean Plasma Glucose: 323.53 mg/dL

## 2022-11-07 LAB — LACTIC ACID, PLASMA
Lactic Acid, Venous: 1.2 mmol/L (ref 0.5–1.9)
Lactic Acid, Venous: 0.7 mmol/L (ref 0.5–1.9)

## 2022-11-07 LAB — LIPASE, BLOOD: Lipase: 37 U/L (ref 11–51)

## 2022-11-07 LAB — HIV ANTIBODY (ROUTINE TESTING W REFLEX): HIV Screen 4th Generation wRfx: NONREACTIVE

## 2022-11-07 MED ORDER — MONTELUKAST SODIUM 10 MG PO TABS
10.0000 mg | ORAL_TABLET | Freq: Every day | ORAL | Status: DC
  Administered 2022-11-07 – 2022-11-10 (×4): 10 mg via ORAL
  Filled 2022-11-07 (×4): qty 1

## 2022-11-07 MED ORDER — INSULIN GLARGINE-YFGN 100 UNIT/ML ~~LOC~~ SOLN
8.0000 [IU] | Freq: Two times a day (BID) | SUBCUTANEOUS | Status: DC
  Administered 2022-11-07 – 2022-11-10 (×6): 8 [IU] via SUBCUTANEOUS
  Filled 2022-11-07 (×9): qty 0.08

## 2022-11-07 MED ORDER — DROPERIDOL 2.5 MG/ML IJ SOLN
2.5000 mg | Freq: Once | INTRAMUSCULAR | Status: AC
  Administered 2022-11-07: 2.5 mg via INTRAVENOUS

## 2022-11-07 MED ORDER — DROPERIDOL 2.5 MG/ML IJ SOLN
INTRAMUSCULAR | Status: AC
  Filled 2022-11-07: qty 2

## 2022-11-07 MED ORDER — ONDANSETRON HCL 4 MG/2ML IJ SOLN
4.0000 mg | Freq: Once | INTRAMUSCULAR | Status: AC
  Administered 2022-11-07: 4 mg via INTRAVENOUS
  Filled 2022-11-07: qty 2

## 2022-11-07 MED ORDER — ESCITALOPRAM OXALATE 10 MG PO TABS
20.0000 mg | ORAL_TABLET | Freq: Every day | ORAL | Status: DC
  Administered 2022-11-07 – 2022-11-10 (×4): 20 mg via ORAL
  Filled 2022-11-07 (×4): qty 2

## 2022-11-07 MED ORDER — LORAZEPAM 2 MG/ML IJ SOLN
2.0000 mg | Freq: Once | INTRAMUSCULAR | Status: AC
  Administered 2022-11-07: 2 mg via INTRAVENOUS

## 2022-11-07 MED ORDER — ONDANSETRON HCL 4 MG/2ML IJ SOLN: 4.0000 mg | Freq: Four times a day (QID) | INTRAMUSCULAR | Status: DC | PRN

## 2022-11-07 MED ORDER — LORAZEPAM 2 MG/ML IJ SOLN
1.0000 mg | Freq: Four times a day (QID) | INTRAMUSCULAR | Status: DC | PRN
  Administered 2022-11-07 – 2022-11-08 (×2): 1 mg via INTRAVENOUS
  Filled 2022-11-07 (×2): qty 1

## 2022-11-07 MED ORDER — SUCRALFATE 1 G PO TABS: 1.0000 g | ORAL_TABLET | Freq: Four times a day (QID) | ORAL | 0 refills | Status: DC | PRN

## 2022-11-07 MED ORDER — MORPHINE SULFATE (PF) 2 MG/ML IV SOLN
2.0000 mg | INTRAVENOUS | Status: DC | PRN
  Administered 2022-11-07 – 2022-11-09 (×15): 2 mg via INTRAVENOUS
  Filled 2022-11-07 (×16): qty 1

## 2022-11-07 MED ORDER — INSULIN REGULAR(HUMAN) IN NACL 100-0.9 UT/100ML-% IV SOLN
INTRAVENOUS | Status: DC
  Administered 2022-11-07: 2.4 [IU]/h via INTRAVENOUS
  Filled 2022-11-07: qty 100

## 2022-11-07 MED ORDER — ONDANSETRON HCL 4 MG PO TABS: 4.0000 mg | ORAL_TABLET | Freq: Four times a day (QID) | ORAL | Status: DC | PRN

## 2022-11-07 MED ORDER — HYDROMORPHONE HCL 1 MG/ML IJ SOLN
1.0000 mg | Freq: Once | INTRAMUSCULAR | Status: AC
  Administered 2022-11-07: 1 mg via INTRAVENOUS
  Filled 2022-11-07: qty 1

## 2022-11-07 MED ORDER — ROSUVASTATIN CALCIUM 20 MG PO TABS
20.0000 mg | ORAL_TABLET | Freq: Every day | ORAL | Status: DC
  Administered 2022-11-07 – 2022-11-10 (×4): 20 mg via ORAL
  Filled 2022-11-07 (×4): qty 1

## 2022-11-07 MED ORDER — INSULIN ASPART 100 UNIT/ML IJ SOLN
0.0000 [IU] | INTRAMUSCULAR | Status: DC
  Administered 2022-11-07: 2 [IU] via SUBCUTANEOUS

## 2022-11-07 MED ORDER — HYDROMORPHONE HCL 1 MG/ML IJ SOLN
2.0000 mg | Freq: Once | INTRAMUSCULAR | Status: AC
  Administered 2022-11-07: 2 mg via INTRAVENOUS
  Filled 2022-11-07: qty 2

## 2022-11-07 MED ORDER — KETAMINE HCL 50 MG/5ML IJ SOSY
0.3000 mg/kg | PREFILLED_SYRINGE | Freq: Once | INTRAMUSCULAR | Status: AC
  Administered 2022-11-07: 30 mg via INTRAVENOUS
  Filled 2022-11-07: qty 5

## 2022-11-07 MED ORDER — INSULIN ASPART 100 UNIT/ML IJ SOLN
0.0000 [IU] | Freq: Every day | INTRAMUSCULAR | Status: DC
  Administered 2022-11-07: 2 [IU] via SUBCUTANEOUS

## 2022-11-07 MED ORDER — LORAZEPAM 2 MG/ML IJ SOLN
INTRAMUSCULAR | Status: AC
  Filled 2022-11-07: qty 1

## 2022-11-07 MED ORDER — INSULIN ASPART 100 UNIT/ML IJ SOLN
0.0000 [IU] | Freq: Three times a day (TID) | INTRAMUSCULAR | Status: DC
  Administered 2022-11-08: 1 [IU] via SUBCUTANEOUS
  Administered 2022-11-08 (×2): 2 [IU] via SUBCUTANEOUS
  Administered 2022-11-09: 1 [IU] via SUBCUTANEOUS
  Administered 2022-11-09: 2 [IU] via SUBCUTANEOUS
  Administered 2022-11-09 – 2022-11-10 (×2): 3 [IU] via SUBCUTANEOUS
  Administered 2022-11-10 (×2): 2 [IU] via SUBCUTANEOUS

## 2022-11-07 MED ORDER — DEXTROSE IN LACTATED RINGERS 5 % IV SOLN: INTRAVENOUS | Status: DC

## 2022-11-07 MED ORDER — OXYCODONE HCL 5 MG PO TABS
5.0000 mg | ORAL_TABLET | ORAL | Status: DC | PRN
  Administered 2022-11-07 – 2022-11-10 (×10): 5 mg via ORAL
  Filled 2022-11-07 (×10): qty 1

## 2022-11-07 MED ORDER — SODIUM CHLORIDE 0.9 % IV SOLN: INTRAVENOUS | Status: DC

## 2022-11-07 MED ORDER — INSULIN ASPART 100 UNIT/ML IV SOLN
5.0000 [IU] | Freq: Once | INTRAVENOUS | Status: AC
  Administered 2022-11-07: 5 [IU] via INTRAVENOUS

## 2022-11-07 MED ORDER — LACTATED RINGERS IV SOLN: INTRAVENOUS | Status: DC

## 2022-11-07 MED ORDER — CHLORHEXIDINE GLUCONATE CLOTH 2 % EX PADS
6.0000 | MEDICATED_PAD | Freq: Every day | CUTANEOUS | Status: DC
  Administered 2022-11-07 – 2022-11-08 (×2): 6 via TOPICAL

## 2022-11-07 MED ORDER — DEXTROSE 50 % IV SOLN: 0.0000 mL | INTRAVENOUS | Status: DC | PRN

## 2022-11-07 MED ORDER — PANTOPRAZOLE SODIUM 40 MG PO TBEC
40.0000 mg | DELAYED_RELEASE_TABLET | Freq: Every day | ORAL | Status: DC
  Administered 2022-11-07 – 2022-11-10 (×4): 40 mg via ORAL
  Filled 2022-11-07 (×4): qty 1

## 2022-11-07 MED ORDER — HEPARIN SODIUM (PORCINE) 5000 UNIT/ML IJ SOLN
5000.0000 [IU] | Freq: Three times a day (TID) | INTRAMUSCULAR | Status: DC
  Administered 2022-11-07 – 2022-11-10 (×11): 5000 [IU] via SUBCUTANEOUS
  Filled 2022-11-07 (×10): qty 1

## 2022-11-07 MED ORDER — KETAMINE HCL 10 MG/ML IJ SOLN
INTRAMUSCULAR | Status: AC
  Filled 2022-11-07: qty 1

## 2022-11-07 MED ORDER — ACETAMINOPHEN 650 MG RE SUPP: 650.0000 mg | Freq: Four times a day (QID) | RECTAL | Status: DC | PRN

## 2022-11-07 MED ORDER — LISINOPRIL 10 MG PO TABS
10.0000 mg | ORAL_TABLET | Freq: Every day | ORAL | Status: DC
  Administered 2022-11-07 – 2022-11-10 (×4): 10 mg via ORAL
  Filled 2022-11-07 (×4): qty 1

## 2022-11-07 MED ORDER — IOHEXOL 350 MG/ML SOLN
100.0000 mL | Freq: Once | INTRAVENOUS | Status: AC | PRN
  Administered 2022-11-07: 100 mL via INTRAVENOUS

## 2022-11-07 MED ORDER — ACETAMINOPHEN 325 MG PO TABS: 650.0000 mg | ORAL_TABLET | Freq: Four times a day (QID) | ORAL | Status: DC | PRN

## 2022-11-07 MED ORDER — SODIUM CHLORIDE 0.9 % IV BOLUS
1000.0000 mL | Freq: Once | INTRAVENOUS | Status: AC
  Administered 2022-11-07: 1000 mL via INTRAVENOUS

## 2022-11-07 NOTE — ED Triage Notes (Signed)
Pt arrived via [facility] from home c/o recurrent abdominal pain, sciatic pain, LLE pain and called EMS tonight for concern of hyperglycemia. Per EMS, Pts CBG on scene was 254. EMS established IV access and administered 300cc NS PTA.

## 2022-11-07 NOTE — ED Notes (Signed)
Pt does report he needs pain control.

## 2022-11-07 NOTE — H&P (Signed)
History and Physical    Patient: Jeff Day ZOX:[id number] DOB: May 04, 1965 DOA: 11/07/2022 DOS: the patient was seen and examined on 11/07/2022 PCP: [facility]  Patient coming from: Home  Chief Complaint:  Chief Complaint  Patient presents with   Abdominal Pain   HPI: Jeff Day is a 57 y.o. male with medical history significant of back pain, sciatic pain, depression with anxiety, diabetes mellitus type 2, GERD, hyperlipidemia, hypertension, and more presents the ED with a chief complaint of abdominal pain, sciatic pain, left lower extremity pain.  Unfortunately, at the time of my exam patient is not able to provide any history.  When asked why he is in the hospital as he says it is for sciatic pain.  When asked where that pain is he does not answer.  When asked if he knows where his pain is he shakes his head no.  He is cooperative with exam but obviously confused.  Patient does have a high anion gap acidosis.  Workup is pending for this including UDS, lactic acidosis, ketones to assess for DKA, and more.  CT head is without acute changes.  Troponin normal x 2.  Patient's left lower extremity is cool compared to his right.  He reports it is nontender to palpation, he can move his leg, foot, toes voluntarily.  It looks like he has been seen by multiple providers for left lower extremity pain.  RN will mark pulses with Doppler.  In the ED dissection study was done with CTA chest abdomen pelvis.  There was no acute finding in the chest abdomen or pelvis.  Chest x-ray was without acute findings.  EKG showed sinus rhythm with a heart rate of 62 and QTc 4 and 36.  Patient had mentioned abdominal pain, lipase was normal at 37.  Patient required multiple medications in the ED including Dilaudid 1 mg followed by Dilaudid 2 mg.  He was given ketamine, Ativan, Zofran, 1 L normal saline.  Patient had intermittent agitation that seem to be related to pain in the ED.  Even during the  acute pain, patient was unable to describe the pain or say where it is.  Unfortunately no further history could be obtained at this time.  Patient is being made full code by default as he is not able to have this conversation at this time. Review of Systems: unable to review all systems due to the inability of the patient to answer questions. Past Medical History:  Diagnosis Date   Back pain    Depression with anxiety    Diabetes (HCC)    GERD (gastroesophageal reflux disease)    Headache    Hyperlipemia    Hypertension    Wears dentures    Past Surgical History:  Procedure Laterality Date   CARDIAC CATHETERIZATION     Normal   CARPAL TUNNEL RELEASE     COLONOSCOPY     HAND SURGERY Right    2006   Social History:  reports that he has quit smoking. He has quit using smokeless tobacco. He reports current alcohol use. He reports that he does not use drugs.  No Known Allergies  Family History  Problem Relation Age of Onset   Obesity Mother    Other Mother        meningitis   Cirrhosis Father    Alcoholism Father    Diabetes Sister    Diabetes Paternal Grandfather    Diabetes Maternal Grandfather    Suicidality  Brother     Prior to Admission medications   Medication Sig Start Date End Date Taking? Authorizing Provider  sucralfate (CARAFATE) 1 g tablet Take 1 tablet (1 g total) by mouth 4 (four) times daily as needed. 11/07/22  Yes Sabas Sous, MD  ACCU-CHEK AVIVA PLUS test strip USE AS DIRECTED 10/20/11   Sondra Barges, PA-C  buPROPion (WELLBUTRIN XL) 150 MG 24 hr tablet Take 150 mg by mouth daily.    [provider]  celecoxib (CELEBREX) 200 MG capsule Take 200 mg by mouth 2 (two) times daily.    [provider]  diclofenac (VOLTAREN) 25 MG EC tablet Take 25 mg by mouth 2 (two) times daily.    [provider]  escitalopram (LEXAPRO) 20 MG tablet Take 20 mg by mouth daily.    [provider]  FARXIGA 10 MG TABS tablet Take 10 mg by mouth  daily. 01/19/20   [provider]  furosemide (LASIX) 20 MG tablet Take 20 mg by mouth daily as needed for fluid. 09/25/22   [provider]  gabapentin (NEURONTIN) 400 MG capsule Take 800 mg by mouth 3 (three) times daily.    [provider]  HUMULIN 70/30 KWIKPEN (70-30) 100 UNIT/ML KwikPen Inject 38 Units into the skin 2 (two) times daily. 09/15/22   [provider]  levocetirizine (XYZAL) 5 MG tablet Take 5 mg by mouth every evening.    [provider]  lisinopril (ZESTRIL) 10 MG tablet Take 10 mg by mouth daily. 09/26/22   [provider]  metFORMIN (GLUCOPHAGE) 1000 MG tablet Take 1,000 mg by mouth 2 (two) times daily with a meal.    [provider]  montelukast (SINGULAIR) 10 MG tablet Take 10 mg by mouth daily.    [provider]  omeprazole (PRILOSEC) 20 MG capsule Take 20 mg by mouth daily.    [provider]  rosuvastatin (CRESTOR) 20 MG tablet Take 20 mg by mouth daily.    [provider]  tiZANidine (ZANAFLEX) 4 MG tablet Take 4 mg by mouth every 8 (eight) hours as needed for muscle spasms. 09/25/22   [provider]  traMADol (ULTRAM) 50 MG tablet Take 50 mg by mouth 2 (two) times daily as needed for moderate pain. 06/23/22   [provider]  traMADol (ULTRAM) 50 MG tablet Take 1 tablet (50 mg total) by mouth every 12 (twelve) hours as needed. 10/21/22   Particia Nearing, PA-C  valACYclovir (VALTREX) 500 MG tablet Take 500 mg by mouth as needed.    [provider]    Physical Exam: Vitals:   11/07/22 0100 11/07/22 0226 11/07/22 0300 11/07/22 0545  BP: (!) 148/91 130/77 113/62 132/63  Pulse: 62 82 (!) 59 60  Resp: 11 20 20 18   Temp:    98 F (36.7 C)  TempSrc:    Oral  SpO2: 99% 97% 96% 98%  Weight:      Height:       1.  General: Patient lying supine in bed,  no acute distress   2. Psychiatric: Alert and oriented x person and place, confused about why he is in  the hospital  3. Neurologic: Speech and language are normal, face is symmetric, moves all 4 extremities voluntarily, at baseline without acute deficits on limited exam   4. HEENMT:  Head is atraumatic, normocephalic, pupils reactive to light, neck is supple, trachea is midline, mucous membranes are moist   5. Respiratory : Lungs  are clear to auscultation bilaterally without wheezing, rhonchi, rales, no cyanosis, no increase in work of breathing or accessory muscle use   6. Cardiovascular : Heart rate normal, rhythm is regular, no murmurs, rubs or gallops, no peripheral edema, left lower extremity pulse less than right lower extremity pulse by palpation   7. Gastrointestinal:  Abdomen is soft, nondistended, nontender to palpation bowel sounds active, no masses or organomegaly palpated   8. Skin:  Skin is warm, dry and intact without rashes, acute lesions, or ulcers on limited exam   9.Musculoskeletal:  No acute deformities or trauma, no asymmetry in tone, no peripheral edema, peripheral pulses palpated, no tenderness to palpation in the extremities  Data Reviewed: In the ED Temp 97.9, heart rate 59-82, respiratory rate 11-20, blood pressure 113/62-166/96, satting at 96% No leukocytosis at 7.9, hemoglobin 13.2 Slight hyponatremia with a sodium 134, slight hyperkalemia with a potassium of 5.2, bicarb 16, glucose 269 Imaging reviewed in HPI Admission requested for altered mental status and intractable vague pain  Assessment and Plan: * Altered mental status - CT head no acute abnormality - Patient becomes more agitated with pain per ED  report - Patient is confused about why he is in the ER, but is not agitated On my exam - Patient has had ketamine, Ativan, droperidol in the ED - Holding gabapentin and Wellbutrin - VBG pending - UDS pending - Likely related to underlying etiology for the anion gap acidosis>> complete workup pending - Continue Ativan as needed Wellsite geologist  tele monitoring  DMII (diabetes mellitus, type 2) (HCC) - Holding Farxiga - For now sliding scale coverage - Glucose initially 269 in the ED - 5 units NovoLog given in the ED - Every 4 hours CBG monitoring - Currently n.p.o.  Essential hypertension - Continue lisinopril  Hyperlipidemia - Continue Crestor  High anion gap metabolic acidosis - Anion gap 16, bicarb 16 - VBG and lactic acid pending - Concern for euglycemic DKA given that patient is on Farxiga - Beta hydroxybutyrate pending, urine ketones pending - Alcohol level pending - Admitting to stepdown so patient can be started on insulin drip if indicated when workup is complete - UDS also pending - Continue to monitor      Advance Care Planning:   Code Status: Full Code  Consults: None at this time  Family Communication: No family at bedside  Severity of Illness: The appropriate patient status for this patient is INPATIENT. Inpatient status is judged to be reasonable and necessary in order to provide the required intensity of service to ensure the patient's safety. The patient's presenting symptoms, physical exam findings, and initial radiographic and laboratory data in the context of their chronic comorbidities is felt to place them at high risk for further clinical deterioration. Furthermore, it is not anticipated that the patient will be medically stable for discharge from the hospital within 2 midnights of admission.   * I certify that at the point of admission it is my clinical judgment that the patient will require inpatient hospital care spanning beyond 2 midnights from the point of admission due to high intensity of service, high risk for further deterioration and high frequency of surveillance required.*  Author: Lilyan Gilford, DO 11/07/2022 6:10 AM  For on call review [email or web address].

## 2022-11-07 NOTE — Assessment & Plan Note (Addendum)
-   CT head no acute abnormality -Multifactorial in the setting of recreational drugs, metabolic acidosis, dehydration and the use of analgesics substances.. -Continue to minimize sedative agents. -Continue to maintain adequate hydration and follow clinical response. -DKA has been resolved and patient has been educated about stopping the use of recreational substances. -Continue constant reorientation and follow clinical response. -mentation is close to baseline now.

## 2022-11-07 NOTE — Progress Notes (Signed)
Patient seen and examined, admitted with abd pain, nausea, worsening back pain (patient with hx of chronic back pain with sciatica) and found to be in DKA and experiencing AMS. No fever, no CP, no overt bleeding, no SOB. CT head neg and hemodynamically stable. Please refer to H&P written by Dr. Carren Rang for further info/details on admission.  Plan: -endo tool for DKA initiated  -close monitoring of electrolytes with repletion as needed to be provided -continue aggressive IVF's resuscitation -recreational drug cessation counseling provided. -constant reorientation and minimizing sedative agents. -follow clinical response  Vassie Loll MD [phone]

## 2022-11-07 NOTE — Discharge Instructions (Signed)
You were evaluated in the Emergency Department and after careful evaluation, we did not find any emergent condition requiring admission or further testing in the hospital.  Your exam/testing today is overall reassuring.  Follow-up with your regular doctor to discuss your symptoms.  Can use the Carafate as needed for discomfort.  Please return to the Emergency Department if you experience any worsening of your condition.   Thank you for allowing [place] to be a part of your care.

## 2022-11-07 NOTE — Assessment & Plan Note (Signed)
-   Continue lisinopril -Low-sodium diet discussed with patient -Continue current antihypertensive agent.

## 2022-11-07 NOTE — ED Provider Notes (Signed)
[facility]-EMERGENCY DEPT [facility] Emergency Department Provider Note MRN:  [id number]  Arrival date & time: 11/07/22     Chief Complaint   Abdominal Pain   History of Present Illness   Jeff Day is a 57 y.o. year-old male with a history of hypertension, diabetes presenting to the ED with chief complaint of abdominal pain.  Patient endorsing severe left upper quadrant abdominal pain, cannot stay still due to the pain.  Also not really answering questions appropriately.  Review of Systems  I was unable to obtain a full/accurate HPI, PMH, or ROS due to the patient's altered mental status.  Patient's Health History    Past Medical History:  Diagnosis Date   Back pain    Depression with anxiety    Diabetes (HCC)    GERD (gastroesophageal reflux disease)    Headache    Hyperlipemia    Hypertension    Wears dentures     Past Surgical History:  Procedure Laterality Date   CARDIAC CATHETERIZATION     Normal   CARPAL TUNNEL RELEASE     COLONOSCOPY     HAND SURGERY Right    2006    Family History  Problem Relation Age of Onset   Obesity Mother    Other Mother        meningitis   Cirrhosis Father    Alcoholism Father    Diabetes Sister    Diabetes Paternal Grandfather    Diabetes Maternal Grandfather    Suicidality Brother     Social History   Socioeconomic History   Marital status: Widowed    Spouse name: Not on file   Number of children: 0   Years of education: 9th grade   Highest education level: Not on file  Occupational History   Occupation: [employer] doctor  Tobacco Use   Smoking status: Former   Smokeless tobacco: Former   Tobacco comments:    Quit 2001  Vaping Use   Vaping status: Every Day   Substances: Nicotine  Substance and Sexual Activity   Alcohol use: Yes    Alcohol/week: 0.0 standard drinks of alcohol    Comment: 4 per month   Drug use: No   Sexual activity: Not on file  Other Topics Concern   Not on file  Social History Narrative    Lives at home with friend.   Three cups caffeine per day.   Right-handed.   Social Determinants of Health   Financial Resource Strain: Not on file  Food Insecurity: Not on file  Transportation Needs: Not on file  Physical Activity: Not on file  Stress: Not on file  Social Connections: Unknown (09/01/2021)   Received from [facility]   Social Network    Social Network: Not on file  Intimate Partner Violence: Unknown (07/24/2021)   Received from [facility]   HITS    Physically Hurt: Not on file    Insult or Talk Down To: Not on file    Threaten Physical Harm: Not on file    Scream or Curse: Not on file     Physical Exam   Vitals:   11/07/22 0226 11/07/22 0300  BP: 130/77 113/62  Pulse: 82 (!) 59  Resp: 20 20  Temp:    SpO2: 97% 96%    CONSTITUTIONAL: Well-appearing, moderate distress due to pain NEURO/PSYCH:  Alert, oriented to name, moves all extremities EYES:  eyes equal and reactive ENT/NECK:  no LAD, no JVD CARDIO: Regular rate, well-perfused, normal S1 and  S2 PULM:  CTAB no wheezing or rhonchi GI/GU:  non-distended, non-tender MSK/SPINE:  No gross deformities, no edema SKIN:  no rash, atraumatic   *Additional and/or pertinent findings included in MDM below  Diagnostic and Interventional Summary    EKG Interpretation Date/Time:  Saturday November 07 2022 01:00:45 EDT Ventricular Rate:  62 PR Interval:  123 QRS Duration:  95 QT Interval:  429 QTC Calculation: 436 R Axis:   67  Text Interpretation: Sinus rhythm Confirmed by Kennis Carina [phone]) on 11/07/2022 1:32:48 AM       Labs Reviewed  CBC - Abnormal; Notable for the following components:      Result Value   RDW 16.9 (*)    All other components within normal limits  COMPREHENSIVE METABOLIC PANEL - Abnormal; Notable for the following components:   Sodium 134 (*)    Potassium 5.2 (*)    CO2 16 (*)    Glucose, Bld 269 (*)    BUN 22 (*)    Total Bilirubin 1.5 (*)    Anion gap 16 (*)    All  other components within normal limits  CBG MONITORING, ED - Abnormal; Notable for the following components:   Glucose-Capillary 243 (*)    All other components within normal limits  CBG MONITORING, ED - Abnormal; Notable for the following components:   Glucose-Capillary 186 (*)    All other components within normal limits  LIPASE, BLOOD  TROPONIN I (HIGH SENSITIVITY)  TROPONIN I (HIGH SENSITIVITY)    CT Angio Chest/Abd/Pel for Dissection W and/or Wo Contrast  Final Result    CT HEAD WO CONTRAST ( )  Final Result    DG Chest Port 1 View  Final Result      Medications  ketamine (KETALAR) 10 MG/ML injection (  Not Given 11/07/22 0156)  HYDROmorphone (DILAUDID) injection 1 mg (1 mg Intravenous Given 11/07/22 0130)  ondansetron (ZOFRAN) injection 4 mg (4 mg Intravenous Given 11/07/22 0131)  sodium chloride 0.9 % bolus 1,000 mL (0 mLs Intravenous Stopped 11/07/22 0259)  droperidol (INAPSINE) 2.5 MG/ML injection 2.5 mg (0 mg Intravenous See Procedure Record 11/07/22 0157)  HYDROmorphone (DILAUDID) injection 2 mg (2 mg Intravenous Given 11/07/22 0139)  ketamine 50 mg in normal saline 5 mL (10 mg/mL) syringe (30 mg Intravenous Given 11/07/22 0501)  iohexol (OMNIPAQUE) 350 MG/ML injection 100 mL (100 mLs Intravenous Contrast Given 11/07/22 0206)  insulin aspart (novoLOG) injection 5 Units (5 Units Intravenous Given 11/07/22 0318)     Procedures  /  Critical Care Procedures  ED Course and Medical Decision Making  Initial Impression and Ddx Severe left upper quadrant/left lower chest pain.  Difficulty controlling the pain, differential diagnosis includes aortic dissection, kidney stone, gastritis, gastric outlet obstruction  Past medical/surgical history that increases complexity of ED encounter: Diabetes  Interpretation of Diagnostics I personally reviewed the EKG and my interpretation is as follows: Sinus rhythm  Labs reveal gap acidosis, otherwise no significant blood count or  electrolyte disturbance  Patient Reassessment and Ultimate Disposition/Management     Patient appeared comfortable for a while after the large amounts of medications.  Now he is experiencing return of discomfort, writhing in the bed, trying to get out of the bed.  Still is unable to really tell us where it hurts, what year it is.  Question gastroparesis, unexplained encephalopathy.  Will admit to medicine.  Patient management required discussion with the following services or consulting groups:  Hospitalist Service  Complexity of Problems Addressed Acute illness or  injury that poses threat of life of bodily function  Additional Data Reviewed and Analyzed Further history obtained from: Further history from spouse/family member  Additional Factors Impacting ED Encounter Risk Consideration of hospitalization  Elmer Sow. Pilar Plate, MD [facility] Emergency [facility] [email or web address]  Final Clinical Impressions(s) / ED Diagnoses     ICD-10-CM   1. Abdominal pain, unspecified abdominal location  R10.9     2. Altered mental status, unspecified altered mental status type  R41.82     3. Increased anion gap metabolic acidosis  E87.29          Discharge Instructions Discussed with and Provided to Patient:       Sabas Sous, MD 11/07/22 [phone]

## 2022-11-07 NOTE — Assessment & Plan Note (Signed)
-  Patient with metabolic acidosis secondary to DKA -Fixed with fluid resuscitation and insulin drip. -Diet has been advanced and patient transition to sliding scale insulin and long-acting insulin -Continue to follow CBGs fluctuation and further adjust hypoglycemic regimen as needed. -Continue to maintain adequate hydration.

## 2022-11-07 NOTE — Assessment & Plan Note (Signed)
Continue Crestor 

## 2022-11-07 NOTE — ED Notes (Signed)
Pt not able to cooperate or answer questions completely in Triage. Pt attempting to slide on to the floor out of his bed. Pt difficult to redirect. Pt keeps repeating "oh god" instead of answering questions.

## 2022-11-07 NOTE — ED Notes (Signed)
Pt refusing to honestly tell this Nurse what he took and how many of his pills he took last night. Pt keeps trying to put himself on the floor

## 2022-11-07 NOTE — ED Notes (Signed)
Pt attempting to get out of bed and put himself on the floor again. Pt keeps saying "oh god. I need to get up." Pt unable to answer any questions at this time. Pt presenting disoriented to time, place, situation and self. EDP at bedside and aware.

## 2022-11-07 NOTE — ED Notes (Signed)
EDP verbally ordered this RN to administer IV Ketamine that was previously held.

## 2022-11-07 NOTE — ED Notes (Signed)
Pt returned from CT Scan 

## 2022-11-07 NOTE — Assessment & Plan Note (Addendum)
-  Continue holding Farxiga at the moment. -Continue sliding scale insulin and long-acting -A1c 12.7 -In the setting of medications noncompliance and diet indiscretion most likely. -Modified carbohydrate diet discussed with patient. -Continue to maintain adequate hydration

## 2022-11-07 NOTE — ED Notes (Signed)
Patient transported to CT With RN 

## 2022-11-07 NOTE — Inpatient Diabetes Management (Signed)
Inpatient Diabetes Program Recommendations  AACE/ADA: New Consensus Statement on Inpatient Glycemic Control   Target Ranges:  Prepandial:   less than 140 mg/dL      Peak postprandial:   less than 180 mg/dL (1-2 hours)      Critically ill patients:  140 - 180 mg/dL    Latest Reference Range & Units 11/07/22 12:51 11/07/22 13:55 11/07/22 14:42 11/07/22 16:47 11/07/22 18:06  Glucose-Capillary 70 - 99 mg/dL 595 (H) 638 (H) 756 (H) 129 (H) 137 (H)    Latest Reference Range & Units 11/07/22 01:26  CO2 22 - 32 mmol/L 16 (L)  Glucose 70 - 99 mg/dL 433 (H)  BUN 6 - 20 mg/dL 22 (H)  Creatinine 2.95 - 1.24 mg/dL 1.88  Calcium 8.9 - 41.6 mg/dL 9.0  Anion gap 5 - 15  16 (H)    Latest Reference Range & Units 11/07/22 06:11 11/07/22 08:29 11/07/22 15:54  Beta-Hydroxybutyric Acid 0.05 - 0.27 mmol/L 4.36 (H) 2.88 (H) 0.46 (H)    Latest Reference Range & Units 11/07/22 06:11  Hemoglobin A1C 4.8 - 5.6 % 12.9 (H)   Review of Glycemic Control  Diabetes history: DM2 Outpatient Diabetes medications: 70/30 40 units BID, Januvia 100 mg daily, Metformin 1000 mg BID, Farxiga 10 mg daily Current orders for Inpatient glycemic control: Semglee 8 units BID, Novolog 0-9 units TID with meals, Novolog 0-5 units QHS  Inpatient Diabetes Program Recommendations:    Insulin: Patient was ordered IV insulin and was transitioned to SQ insulin this evening.   HbgA1C: A1C 12.9% on 11/07/22 indicating an average glucose of 323 mg/dl over the past 2-3 months.  NOTE: Noted consult for Diabetes Coordinator. Diabetes Coordinator is not on campus over the weekend but available by pager from 8am to 5pm for questions or concerns.  Chart reviewed. Patient admitted with altered mental status, DM, hypertension, and metabolic acidosis. Initial glucose 269 mg/dl on 09/23/28 and patient was initially ordered IV insulin which was transitioned to SQ insulin this evening. Received Semglee 8 units at 17:30 today. If patient remains inpatient  over the weekend, diabetes coordinator will follow up with the patient on 11/09/22.  Thanks, Orlando Penner, RN, MSN, CDCES Diabetes Coordinator Inpatient Diabetes Program [phone] (Team Pager from 8am to 5pm)

## 2022-11-08 DIAGNOSIS — I10 Essential (primary) hypertension: Secondary | ICD-10-CM | POA: Diagnosis not present

## 2022-11-08 DIAGNOSIS — E8729 Other acidosis: Secondary | ICD-10-CM | POA: Diagnosis not present

## 2022-11-08 DIAGNOSIS — E782 Mixed hyperlipidemia: Secondary | ICD-10-CM | POA: Diagnosis not present

## 2022-11-08 DIAGNOSIS — R410 Disorientation, unspecified: Secondary | ICD-10-CM | POA: Diagnosis not present

## 2022-11-08 LAB — COMPREHENSIVE METABOLIC PANEL
ALT: 24 U/L (ref 0–44)
AST: 22 U/L (ref 15–41)
Albumin: 3.2 g/dL — ABNORMAL LOW (ref 3.5–5.0)
Alkaline Phosphatase: 45 U/L (ref 38–126)
Anion gap: 6 (ref 5–15)
BUN: 16 mg/dL (ref 6–20)
CO2: 22 mmol/L (ref 22–32)
Calcium: 8.6 mg/dL — ABNORMAL LOW (ref 8.9–10.3)
Chloride: 107 mmol/L (ref 98–111)
Creatinine, Ser: 0.83 mg/dL (ref 0.61–1.24)
GFR, Estimated: >60 mL/min (ref >60)
Glucose, Bld: 166 mg/dL — ABNORMAL HIGH (ref 70–99)
Potassium: 3.8 mmol/L (ref 3.5–5.1)
Sodium: 135 mmol/L (ref 135–145)
Total Bilirubin: 1 mg/dL (ref 0.3–1.2)
Total Protein: 6 g/dL — ABNORMAL LOW (ref 6.5–8.1)

## 2022-11-08 LAB — GLUCOSE, CAPILLARY
Glucose-Capillary: 162 mg/dL — ABNORMAL HIGH (ref 70–99)
Glucose-Capillary: 150 mg/dL — ABNORMAL HIGH (ref 70–99)
Glucose-Capillary: 173 mg/dL — ABNORMAL HIGH (ref 70–99)
Glucose-Capillary: 161 mg/dL — ABNORMAL HIGH (ref 70–99)
Glucose-Capillary: 159 mg/dL — ABNORMAL HIGH (ref 70–99)

## 2022-11-08 LAB — CBC WITH DIFFERENTIAL/PLATELET
Abs Immature Granulocytes: 0.03 10*3/uL (ref 0.00–0.07)
Basophils Absolute: 0.1 10*3/uL (ref 0.0–0.1)
Basophils Relative: 1 %
Eosinophils Absolute: 0.2 10*3/uL (ref 0.0–0.5)
Eosinophils Relative: 3 %
HCT: 33.9 % — ABNORMAL LOW (ref 39.0–52.0)
Hemoglobin: 11.2 g/dL — ABNORMAL LOW (ref 13.0–17.0)
Immature Granulocytes: 1 %
Lymphocytes Relative: 32 %
Lymphs Abs: 1.8 10*3/uL (ref 0.7–4.0)
MCH: 29.6 pg (ref 26.0–34.0)
MCHC: 33 g/dL (ref 30.0–36.0)
MCV: 89.4 fL (ref 80.0–100.0)
Monocytes Absolute: 0.4 10*3/uL (ref 0.1–1.0)
Monocytes Relative: 8 %
Neutro Abs: 3.1 10*3/uL (ref 1.7–7.7)
Neutrophils Relative %: 55 %
Platelets: 197 10*3/uL (ref 150–400)
RBC: 3.79 MIL/uL — ABNORMAL LOW (ref 4.22–5.81)
RDW: 16.9 % — ABNORMAL HIGH (ref 11.5–15.5)
WBC: 5.6 10*3/uL (ref 4.0–10.5)
nRBC: 0 % (ref 0.0–0.2)

## 2022-11-08 LAB — MAGNESIUM: Magnesium: 1.5 mg/dL — ABNORMAL LOW (ref 1.7–2.4)

## 2022-11-08 LAB — VITAMIN B12: Vitamin B-12: 1084 pg/mL — ABNORMAL HIGH (ref 180–914)

## 2022-11-08 LAB — TSH: TSH: 1.653 u[IU]/mL (ref 0.350–4.500)

## 2022-11-08 MED ORDER — METHOCARBAMOL 1000 MG/10ML IJ SOLN: 500.0000 mg | Freq: Three times a day (TID) | INTRAVENOUS | Status: DC | PRN

## 2022-11-08 MED ORDER — MAGNESIUM SULFATE 2 GM/50ML IV SOLN
2.0000 g | Freq: Once | INTRAVENOUS | Status: AC
  Administered 2022-11-08: 2 g via INTRAVENOUS
  Filled 2022-11-08: qty 50

## 2022-11-08 MED ORDER — KETOROLAC TROMETHAMINE 30 MG/ML IJ SOLN
30.0000 mg | Freq: Three times a day (TID) | INTRAMUSCULAR | Status: DC | PRN
  Administered 2022-11-08 – 2022-11-09 (×2): 30 mg via INTRAVENOUS
  Filled 2022-11-08 (×2): qty 1

## 2022-11-08 NOTE — Progress Notes (Signed)
Progress Note   Patient: Jeff Day LKG:[id number] DOB: 29-Dec-1965 DOA: 11/07/2022     1 DOS: the patient was seen and examined on 11/08/2022   Brief hospital admission narrative: As per H&P written by Dr.Zierle-Ghosh on 11/07/22 [name] is a 57 y.o. male with medical history significant of back pain, sciatic pain, depression with anxiety, diabetes mellitus type 2, GERD, hyperlipidemia, hypertension, and more presents the ED with a chief complaint of abdominal pain, sciatic pain, left lower extremity pain.  Unfortunately, at the time of my exam patient is not able to provide any history.  When asked why he is in the hospital as he says it is for sciatic pain.  When asked where that pain is he does not answer.  When asked if he knows where his pain is he shakes his head no.  He is cooperative with exam but obviously confused.  Patient does have a high anion gap acidosis.  Workup is pending for this including UDS, lactic acidosis, ketones to assess for DKA, and more.  CT head is without acute changes.  Troponin normal x 2.  Patient's left lower extremity is cool compared to his right.  He reports it is nontender to palpation, he can move his leg, foot, toes voluntarily.  It looks like he has been seen by multiple providers for left lower extremity pain.  RN will mark pulses with Doppler.  In the ED dissection study was done with CTA chest abdomen pelvis.  There was no acute finding in the chest abdomen or pelvis.  Chest x-ray was without acute findings.  EKG showed sinus rhythm with a heart rate of 62 and QTc 4 and 36.  Patient had mentioned abdominal pain, lipase was normal at 37.  Patient required multiple medications in the ED including Dilaudid 1 mg followed by Dilaudid 2 mg.  He was given ketamine, Ativan, Zofran, 1 L normal saline.  Patient had intermittent agitation that seem to be related to pain in the ED.  Even during the acute pain, patient was unable to describe the pain or say where it  is.  Unfortunately no further history could be obtained at this time.    Assessment and Plan: * Altered mental status - CT head no acute abnormality -Multifactorial in the setting of recreational drugs, metabolic acidosis dehydration and recreational substances. -Continue to minimize sedative agents -Continue to maintain adequate hydration and follow clinical response -DKA has been resolved and patient has been educated about stopping the use of recreational substances. -Continue constant reorientation and follow clinical response.  DMII (diabetes mellitus, type 2) (HCC) - Continue holding Farxiga -Continue sliding scale insulin and long-acting -A1c 12.7 -In the setting of medications noncompliance. -Modified carbohydrate diet discussed with patient.  Essential hypertension - Continue lisinopril -Low-sodium diet discussed with patient -Continue current antihypertensive agent.  Hyperlipidemia -Continue Crestor -Heart healthy diet discussed with patient.  High anion gap metabolic acidosis - Patient with metabolic acidosis secondary to DKA -Fixed with fluid resuscitation and insulin drip  -Diet has been advanced and patient transition to sliding scale insulin and long-acting insulin -Continue to follow CBGs fluctuation and further adjust hypoglycemic regimen as needed.  Low back pain -Lumbar MRI will be checked -Continue as needed analgesics and supportive care.  Subjective:  Demonstrated improvement in mentation but is still not back to baseline; patient denies chest pain, nausea vomiting and is afebrile.  Complaining of left lumbar back pain and sciatica.  Physical Exam: Vitals:   11/08/22 1100  11/08/22 1200 11/08/22 1300 11/08/22 1400  BP: (!) 160/63 (!) 164/54    Pulse: (!) 57 (!) 52 (!) 54 (!) 53  Resp: 13 20 11 16   Temp:      TempSrc:      SpO2: 99% 100% 100% 97%  Weight:      Height:       General exam: Alert, awake, oriented x 2; per wife at bedside mentation  improved but not back to baseline.  Still complaining of left lower back pain with associated sciatica. Respiratory system: Clear to auscultation. Respiratory effort normal.  Good saturation on room air. Cardiovascular system:RRR. No rubs or gallops; no JVD. Gastrointestinal system: Abdomen is nondistended, soft and nontender. No organomegaly or masses felt. Normal bowel sounds heard. Central nervous system: Alert and oriented. No focal neurological deficits. Extremities: No cyanosis or clubbing. Skin: No petechiae. Psychiatry: Mood & affect appropriate.   Data Reviewed: B12 1084 Comprehensive metabolic panel: Sodium 135, potassium 3.8, chloride 107, bicarb 22, BUN 16, creatinine 0.83, normal LFTs and a GFR >60 Magnesium: 1.5 CBC: WBCs 5.6, hemoglobin 11.2 and platelet count 197 K TSH: 1.63   Family Communication: Wife at bedside  Disposition: Status is: Inpatient Remains inpatient appropriate because: Continue to stabilize electrolytes, check lumbar MRI and continue supportive care while achieving back to baseline mentation.   Planned Discharge Destination: Home  Time spent: 50 minutes  Author: Vassie Loll, MD 11/08/2022 3:24 PM  For on call review [email or web address].

## 2022-11-08 NOTE — Plan of Care (Signed)

## 2022-11-09 ENCOUNTER — Inpatient Hospital Stay (HOSPITAL_COMMUNITY): Payer: Medicaid Other

## 2022-11-09 DIAGNOSIS — R410 Disorientation, unspecified: Secondary | ICD-10-CM | POA: Diagnosis not present

## 2022-11-09 DIAGNOSIS — M545 Low back pain, unspecified: Secondary | ICD-10-CM

## 2022-11-09 DIAGNOSIS — E1165 Type 2 diabetes mellitus with hyperglycemia: Secondary | ICD-10-CM | POA: Diagnosis not present

## 2022-11-09 DIAGNOSIS — E782 Mixed hyperlipidemia: Secondary | ICD-10-CM | POA: Diagnosis not present

## 2022-11-09 DIAGNOSIS — E8729 Other acidosis: Secondary | ICD-10-CM | POA: Diagnosis not present

## 2022-11-09 DIAGNOSIS — Z794 Long term (current) use of insulin: Secondary | ICD-10-CM

## 2022-11-09 LAB — BASIC METABOLIC PANEL
Anion gap: 7 (ref 5–15)
BUN: 15 mg/dL (ref 6–20)
CO2: 23 mmol/L (ref 22–32)
Calcium: 8.2 mg/dL — ABNORMAL LOW (ref 8.9–10.3)
Chloride: 105 mmol/L (ref 98–111)
Creatinine, Ser: 0.67 mg/dL (ref 0.61–1.24)
GFR, Estimated: >60 mL/min (ref >60)
Glucose, Bld: 145 mg/dL — ABNORMAL HIGH (ref 70–99)
Potassium: 3.6 mmol/L (ref 3.5–5.1)
Sodium: 135 mmol/L (ref 135–145)

## 2022-11-09 LAB — GLUCOSE, CAPILLARY
Glucose-Capillary: 145 mg/dL — ABNORMAL HIGH (ref 70–99)
Glucose-Capillary: 163 mg/dL — ABNORMAL HIGH (ref 70–99)
Glucose-Capillary: 207 mg/dL — ABNORMAL HIGH (ref 70–99)
Glucose-Capillary: 190 mg/dL — ABNORMAL HIGH (ref 70–99)

## 2022-11-09 NOTE — Progress Notes (Signed)
Patient medicated several times for pain on this shift. When asking patient his pain his pain level before and after medication it is at a constant 8. He states the pain is in his lower back and radiates down his left leg into his left foot.

## 2022-11-09 NOTE — Inpatient Diabetes Management (Signed)
Inpatient Diabetes Program Recommendations  AACE/ADA: New Consensus Statement on Inpatient Glycemic Control   Target Ranges:  Prepandial:   less than 140 mg/dL      Peak postprandial:   less than 180 mg/dL (1-2 hours)      Critically ill patients:  140 - 180 mg/dL    Review of Glycemic Control   Diabetes history: DM2 Outpatient Diabetes medications: 70/30 40 units BID, Januvia 100 mg daily, Metformin 1000 mg BID, Farxiga 10 mg daily Current orders for Inpatient glycemic control: Semglee 8 units BID, Novolog 0-9 units TID with meals, Novolog 0-5 units QHS   Inpatient Diabetes Program Recommendations:     HbgA1C: A1C 12.9% on 11/07/22 indicating an average glucose of 323 mg/dl over the past 2-3 months.   NOTE: Spoke with patient over the phone about diabetes and home regimen for diabetes control. Patient reports being followed by PCP for diabetes management and currently taking 70/30 40 units BID, Januvia 100 mg daily, Metformin 1000 mg BID, and Farxiga 10 mg daily as an outpatient for diabetes control. Patient reports taking DM medications as prescribed.  Patient reports checking glucose at home and reports that it is running a little high sometimes.   Inquired about prior A1C and patient reports last A1C was 13%.  Discussed A1C results (12.9% pm 11/07/22) and explained that current A1C indicates an average glucose of 323 mg/dl over the past 2-3 months. Discussed glucose and A1C goals. Discussed importance of checking CBGs and maintaining good CBG control to prevent long-term and short-term complications. Stressed to the patient the importance of improving glycemic control to prevent further complications from uncontrolled diabetes. Discussed impact of nutrition, exercise, stress, sickness, and medications on diabetes control.  Discussed carbohydrates, carbohydrate goals per day and meal, along with portion sizes. Encouraged patient to talk to his PCP about being referred to an Endocrinologist to  assist with getting DM under better control to decrease risk of further complications. Patient states he has everything he needs at home for DM management.  Patient verbalized understanding of information discussed and reports no further questions at this time related to diabetes.  Thanks, Orlando Penner, RN, MSN, CDE Diabetes Coordinator Inpatient Diabetes Program [phone] (Team Pager)

## 2022-11-09 NOTE — Progress Notes (Signed)
  Pt admitted due to altered mental status. He reports he lives alone and is independent with ADLs. No home health prior to admission. TOC will continue to follow.    11/09/22 0755  TOC Brief Assessment  Insurance and Status Reviewed  Patient has primary care physician Yes  Home environment has been reviewed Lives alone.  Prior level of function: Independent.  Prior/Current Home Services No current home services  Social Determinants of Health Reivew SDOH reviewed no interventions necessary  Readmission risk has been reviewed Yes  Transition of care needs no transition of care needs at this time

## 2022-11-09 NOTE — Progress Notes (Signed)
Progress Note   Patient: Jeff Day:[id number] DOB: 09-15-1965 DOA: 11/07/2022     2 DOS: the patient was seen and examined on 11/09/2022   Brief hospital admission narrative: As per H&P written by Dr.Zierle-Ghosh on 11/07/22 Jeff Day is a 57 y.o. male with medical history significant of back pain, sciatic pain, depression with anxiety, diabetes mellitus type 2, GERD, hyperlipidemia, hypertension, and more presents the ED with a chief complaint of abdominal pain, sciatic pain, left lower extremity pain.  Unfortunately, at the time of my exam patient is not able to provide any history.  When asked why he is in the hospital as he says it is for sciatic pain.  When asked where that pain is he does not answer.  When asked if he knows where his pain is he shakes his head no.  He is cooperative with exam but obviously confused.  Patient does have a high anion gap acidosis.  Workup is pending for this including UDS, lactic acidosis, ketones to assess for DKA, and more.  CT head is without acute changes.  Troponin normal x 2.  Patient's left lower extremity is cool compared to his right.  He reports it is nontender to palpation, he can move his leg, foot, toes voluntarily.  It looks like he has been seen by multiple providers for left lower extremity pain.  RN will mark pulses with Doppler.  In the ED dissection study was done with CTA chest abdomen pelvis.  There was no acute finding in the chest abdomen or pelvis.  Chest x-ray was without acute findings.  EKG showed sinus rhythm with a heart rate of 62 and QTc 4 and 36.  Patient had mentioned abdominal pain, lipase was normal at 37.  Patient required multiple medications in the ED including Dilaudid 1 mg followed by Dilaudid 2 mg.  He was given ketamine, Ativan, Zofran, 1 L normal saline.  Patient had intermittent agitation that seem to be related to pain in the ED.  Even during the acute pain, patient was unable to describe the pain or say where it  is.  Unfortunately no further history could be obtained at this time.    Assessment and Plan: * Altered mental status - CT head no acute abnormality -Multifactorial in the setting of recreational drugs, metabolic acidosis, dehydration and the use of analgesics substances.. -Continue to minimize sedative agents. -Continue to maintain adequate hydration and follow clinical response. -DKA has been resolved and patient has been educated about stopping the use of recreational substances. -Continue constant reorientation and follow clinical response. -mentation is close to baseline now.  DMII (diabetes mellitus, type 2) (HCC) -Continue holding Farxiga at the moment. -Continue sliding scale insulin and long-acting -A1c 12.7 -In the setting of medications noncompliance and diet indiscretion most likely. -Modified carbohydrate diet discussed with patient. -Continue to maintain adequate hydration  Essential hypertension - Continue lisinopril -Low-sodium diet discussed with patient -Continue current antihypertensive agent.  Hyperlipidemia -Continue Crestor -Heart healthy diet discussed with patient.  High anion gap metabolic acidosis -Patient with metabolic acidosis secondary to DKA -Fixed with fluid resuscitation and insulin drip. -Diet has been advanced and patient transition to sliding scale insulin and long-acting insulin -Continue to follow CBGs fluctuation and further adjust hypoglycemic regimen as needed. -Continue to maintain adequate hydration.  Low back pain -Lumbar MRI will be checked -Continue as needed analgesics and supportive care. -Physical therapy has been requested -Continue judicious analgesic and muscle relaxant medication.  Subjective:  Mentation  improved and close to baseline; no fever, no nausea, no chest pain or shortness of breath.  Continue complaining of lower back pain.  Physical Exam: Vitals:   11/08/22 1556 11/08/22 2044 11/09/22 0359 11/09/22 1243   BP: (!) 164/75 (!) 162/74 (!) 149/67 (!) 141/65  Pulse: (!) 54 (!) 52 (!) 51   Resp: 20 20 19 18   Temp: 98.7 F (37.1 C) 98.9 F (37.2 C) 97.8 F (36.6 C) 98.5 F (36.9 C)  TempSrc: Oral Oral Oral   SpO2: 99% 98% 98% 100%  Weight:      Height:       General exam: Alert, awake, following commands appropriately.  Continues present lower back pain with sciatica. Respiratory system: Clear to auscultation. Respiratory effort normal.  Good saturation on room air.  No using accessory muscle. Cardiovascular system:RRR. No rubs or gallops; no JVD. Gastrointestinal system: Abdomen is obese, nondistended, soft and nontender.  Positive bowel sounds. Central nervous system: No focal neurological deficits. Extremities: No cyanosis or clubbing; no edema. Skin: No petechiae. Psychiatry: Judgement and insight appear normal.  Slow to respond but appropriate; mood & affect appropriate.   Data Reviewed: B12 1084 Comprehensive metabolic panel: Sodium 135, potassium 3.6, chloride 105, bicarb 23, glucose 145, BUN 15, creatinine 0.67 and GFR >60 Magnesium: 1.5 CBC: WBCs 5.6, hemoglobin 11.2 and platelet count 197 K TSH: 1.63   Family Communication: no family at bedside  Disposition: Status is: Inpatient Remains inpatient appropriate because: Continue to stabilize electrolytes, check lumbar MRI and continue supportive care while achieving back to baseline mentation.   Planned Discharge Destination: Home  Time spent: 35 minutes  Author: Vassie Loll, MD 11/09/2022 1:19 PM  For on call review [email or web address].

## 2022-11-09 NOTE — Progress Notes (Signed)
Mobility Specialist Progress Note:    11/09/22 1153  Mobility  Activity Ambulated with assistance in hallway  Level of Assistance Modified independent, requires aide device or extra time  Assistive Device Other (Comment) (Handrails)  Distance Ambulated (ft) 80 ft  Range of Motion/Exercises Active;All extremities  Activity Response Tolerated well  Mobility Referral Yes  $Mobility charge 1 Mobility  Mobility Specialist Start Time (ACUTE ONLY) 1145  Mobility Specialist Stop Time (ACUTE ONLY) 1200  Mobility Specialist Time Calculation (min) (ACUTE ONLY) 15 min   Pt received in chair, agreeable to mobility session. ModI required with hand rails and IV pole for stability. Ambulated 80 ft in hallway, upon return, pt experienced sudden L thigh cramp (rated 10/10 pain). Wheeled pt back to room in [facility]. Left pt in recliner, all needs met. Nurse notified.   Feliciana Rossetti Mobility Specialist Please contact via Special educational needs teacher or  Rehab office at [phone]

## 2022-11-10 DIAGNOSIS — E1165 Type 2 diabetes mellitus with hyperglycemia: Secondary | ICD-10-CM | POA: Diagnosis not present

## 2022-11-10 DIAGNOSIS — E782 Mixed hyperlipidemia: Secondary | ICD-10-CM | POA: Diagnosis not present

## 2022-11-10 DIAGNOSIS — M79605 Pain in left leg: Secondary | ICD-10-CM

## 2022-11-10 DIAGNOSIS — E8729 Other acidosis: Secondary | ICD-10-CM | POA: Diagnosis not present

## 2022-11-10 DIAGNOSIS — R410 Disorientation, unspecified: Secondary | ICD-10-CM | POA: Diagnosis not present

## 2022-11-10 DIAGNOSIS — M545 Low back pain, unspecified: Secondary | ICD-10-CM

## 2022-11-10 LAB — GLUCOSE, CAPILLARY
Glucose-Capillary: 161 mg/dL — ABNORMAL HIGH (ref 70–99)
Glucose-Capillary: 217 mg/dL — ABNORMAL HIGH (ref 70–99)
Glucose-Capillary: 154 mg/dL — ABNORMAL HIGH (ref 70–99)

## 2022-11-10 MED ORDER — METHOCARBAMOL 750 MG PO TABS: 750.0000 mg | ORAL_TABLET | Freq: Three times a day (TID) | ORAL | 0 refills | Status: DC | PRN

## 2022-11-10 MED ORDER — PREDNISONE 20 MG PO TABS: 40.0000 mg | ORAL_TABLET | Freq: Every day | ORAL | 0 refills | Status: AC

## 2022-11-10 MED ORDER — OXYCODONE HCL 5 MG PO TABS: 5.0000 mg | ORAL_TABLET | Freq: Four times a day (QID) | ORAL | 0 refills | Status: DC | PRN

## 2022-11-10 MED ORDER — PANTOPRAZOLE SODIUM 40 MG PO TBEC: 40.0000 mg | DELAYED_RELEASE_TABLET | Freq: Two times a day (BID) | ORAL | 2 refills | Status: AC

## 2022-11-10 MED ORDER — GABAPENTIN 300 MG PO CAPS: 300.0000 mg | ORAL_CAPSULE | Freq: Three times a day (TID) | ORAL | 1 refills | Status: DC

## 2022-11-10 MED ORDER — HUMULIN 70/30 KWIKPEN (70-30) 100 UNIT/ML ~~LOC~~ SUPN: 50.0000 [IU] | PEN_INJECTOR | Freq: Two times a day (BID) | SUBCUTANEOUS | 3 refills | Status: DC

## 2022-11-10 NOTE — Progress Notes (Signed)
Mobility Specialist Progress Note:    11/10/22 1208  Mobility  Activity Ambulated with assistance in room  Level of Assistance Contact guard assist, steadying assist  Distance Ambulated (ft) 12 ft  Range of Motion/Exercises Active  Activity Response Tolerated well  Mobility Referral Yes  $Mobility charge 1 Mobility  Mobility Specialist Start Time (ACUTE ONLY) 1200  Mobility Specialist Stop Time (ACUTE ONLY) 1208  Mobility Specialist Time Calculation (min) (ACUTE ONLY) 8 min   Pt received in bed, agreeable to ambulating in room. Friend at bedside. Pt did not report pain at beginning of session, ambulated to door and back with ModI (IV pole). Tolerated well. Pt reported L thigh cramp (rated 9/10). Ended session, pt requested to sit up in chair. All needs met, call bell in reach, nurse notified.   Feliciana Rossetti Mobility Specialist Please contact via Special educational needs teacher or  Rehab office at [phone]

## 2022-11-10 NOTE — Progress Notes (Signed)
Patient discharged home today, transported home by family. Discharge summary went over with patient, patient verbalized understanding. Belongings sent home with patient.  

## 2022-11-10 NOTE — Discharge Summary (Signed)
Physician Discharge Summary   Patient: Jeff Day MRN: [id number] DOB: [birth year]  Admit date:     11/07/2022  Discharge date: 11/10/22  Discharge Physician: Vassie Loll   PCP: [facility]   Recommendations at discharge:  Continue close monitoring of patient's CBGs/A1c with further adjustment to hypoglycemic regimen as required Repeat basic metabolic panel to follow ultralights and renal function Reassess blood pressure and adjust antihypertensive regimen as needed Make sure patient has follow-up with neurosurgery as instructed. Continue to assist patient with recreational drug cessation.  Discharge Diagnoses: Principal Problem:   Altered mental status Active Problems:   High anion gap metabolic acidosis   Hyperlipidemia   Essential hypertension   DMII (diabetes mellitus, type 2) [facility])  Hospital Course: Brief hospital admission narrative: As per H&P written by Dr.Zierle-Ghosh on 11/07/22 Jeff Day is a 57 y.o. male with medical history significant of back pain, sciatic pain, depression with anxiety, diabetes mellitus type 2, GERD, hyperlipidemia, hypertension, and more presents the ED with a chief complaint of abdominal pain, sciatic pain, left lower extremity pain.  Unfortunately, at the time of my exam patient is not able to provide any history.  When asked why he is in the hospital as he says it is for sciatic pain.  When asked where that pain is he does not answer.  When asked if he knows where his pain is he shakes his head no.  He is cooperative with exam but obviously confused.  Patient does have a high anion gap acidosis.  Workup is pending for this including UDS, lactic acidosis, ketones to assess for DKA, and more.  CT head is without acute changes.  Troponin normal x 2.  Patient's left lower extremity is cool compared to his right.  He reports it is nontender to palpation, he can move his leg, foot, toes voluntarily.  It looks like he has been  seen by multiple providers for left lower extremity pain.  RN will mark pulses with Doppler.  In the ED dissection study was done with CTA chest abdomen pelvis.  There was no acute finding in the chest abdomen or pelvis.  Chest x-ray was without acute findings.  EKG showed sinus rhythm with a heart rate of 62 and QTc 4 and 36.  Patient had mentioned abdominal pain, lipase was normal at 37.  Patient required multiple medications in the ED including Dilaudid 1 mg followed by Dilaudid 2 mg.  He was given ketamine, Ativan, Zofran, 1 L normal saline.  Patient had intermittent agitation that seem to be related to pain in the ED.  Even during the acute pain, patient was unable to describe the pain or say where it is.  Unfortunately no further history could be obtained at this time.  Assessment and Plan: * Altered mental status - CT head no acute abnormality -Multifactorial in the setting of recreational drugs, metabolic acidosis, dehydration and the use of analgesics substances.. -Continue to maintain adequate hydration and follow follow-up with primary care physician in the next 10-14 days. -DKA has been resolved and patient has been educated about stopping the use of recreational substances and medication compliance. -Mentation is back to baseline at time of discharge.   DMII (diabetes mellitus, type 2) (HCC) -Patient reported no plan to continue Farxiga at the moment. -Resume adjusted dose of insulin therapy at time of discharge and resume oral Januvia and metformin. -A1c 12.7 -In the setting of medications noncompliance and diet indiscretion most likely. -Modified  carbohydrate diet discussed with patient. -Continue to maintain adequate hydration   Essential hypertension - Continue lisinopril -Low-sodium diet discussed with patient -Continue current antihypertensive agent.   Hyperlipidemia -Continue Crestor -Heart healthy diet discussed with patient.   High anion gap metabolic  acidosis -Patient with metabolic acidosis secondary to DKA -Fixed with fluid resuscitation and insulin drip. -Diet has been advanced and patient transition to subcutaneous insulin -Metabolic acidosis has been resolved. -Continue to maintain adequate hydration and follow CBGs/basic metabolic panel.   Low back pain -Lumbar MRI demonstrating central disc protrusion at L4-L5 with cord irritation -Case discussed with neurosurgery who recommended trial of Neurontin, analgesics, steroids and muscle relaxant; They will follow patient up after discharge.  Consultants: Neurosurgery (Dr. Hoyt Koch) Procedures performed: See below for x-ray reports. Disposition: Home Diet recommendation: Heart healthy and modified carbohydrate diet.  DISCHARGE MEDICATION: Allergies as of 11/10/2022   No Known Allergies      Medication List     STOP taking these medications    celecoxib 200 MG capsule Commonly known as: CELEBREX   diclofenac 25 MG EC tablet Commonly known as: VOLTAREN   omeprazole 20 MG capsule Commonly known as: PRILOSEC Replaced by: pantoprazole 40 MG tablet   tiZANidine 4 MG tablet Commonly known as: ZANAFLEX   traMADol 50 MG tablet Commonly known as: ULTRAM       TAKE these medications    Accu-Chek Aviva Plus test strip Generic drug: glucose blood USE AS DIRECTED   buPROPion 150 MG 24 hr tablet Commonly known as: WELLBUTRIN XL Take 150 mg by mouth daily.   escitalopram 20 MG tablet Commonly known as: LEXAPRO Take 20 mg by mouth daily.   gabapentin 300 MG capsule Commonly known as: NEURONTIN Take 1 capsule (300 mg total) by mouth 3 (three) times daily. What changed:  medication strength how much to take   HumuLIN 70/30 KwikPen (70-30) 100 UNIT/ML KwikPen Generic drug: insulin isophane & regular human KwikPen Inject 50 Units into the skin 2 (two) times daily. What changed: how much to take   Januvia 100 MG tablet Generic drug: sitaGLIPtin Take 1  tablet by mouth daily.   levocetirizine 5 MG tablet Commonly known as: XYZAL Take 5 mg by mouth every evening.   lisinopril 10 MG tablet Commonly known as: ZESTRIL Take 10 mg by mouth daily.   metFORMIN 1000 MG tablet Commonly known as: GLUCOPHAGE Take 1,000 mg by mouth 2 (two) times daily with a meal.   methocarbamol 750 MG tablet Commonly known as: Robaxin-750 Take 1 tablet (750 mg total) by mouth every 8 (eight) hours as needed for muscle spasms.   montelukast 10 MG tablet Commonly known as: SINGULAIR Take 10 mg by mouth daily.   oxyCODONE 5 MG immediate release tablet Commonly known as: Oxy IR/ROXICODONE Take 1 tablet (5 mg total) by mouth every 6 (six) hours as needed for severe pain.   pantoprazole 40 MG tablet Commonly known as: PROTONIX Take 1 tablet (40 mg total) by mouth 2 (two) times daily. Replaces: omeprazole 20 MG capsule   predniSONE 20 MG tablet Commonly known as: DELTASONE Take 2 tablets (40 mg total) by mouth daily for 7 days.   rosuvastatin 20 MG tablet Commonly known as: CRESTOR Take 20 mg by mouth daily.   sucralfate 1 g tablet Commonly known as: Carafate Take 1 tablet (1 g total) by mouth 4 (four) times daily as needed.   valACYclovir 500 MG tablet Commonly known as: VALTREX Take 500 mg by mouth  as needed.        Follow-up Information     Bedelia Person, MD. Schedule an appointment as soon as possible for a visit.   Specialty: Neurosurgery Contact information: [address] [place] [address] [phone]                Discharge Exam: Ceasar Mons Weights   11/07/22 0057 11/07/22 0650 11/08/22 0443  Weight: 100 kg 91.4 kg 94.6 kg   General exam: Alert, awake, oriented x 3; following commands appropriately and feeling ready to go home. Respiratory system: Clear to auscultation. Respiratory effort normal.  Good saturation on room air. Cardiovascular system:RRR. No rubs or gallops; no JVD. Gastrointestinal  system: Abdomen is nondistended, soft and nontender. No organomegaly or masses felt. Normal bowel sounds heard. Central nervous system: No focal neurologic deficit appreciated; patient continue reporting left lower leg sciatic pain after standing for too long and engaging into activities. Extremities: No cyanosis or clubbing. Skin: No petechiae. Psychiatry: Judgement and insight appear normal. Mood & affect appropriate.    Condition at discharge: Stable and improved.  The results of significant diagnostics from this hospitalization (including imaging, microbiology, ancillary and laboratory) are listed below for reference.   Imaging Studies: MR LUMBAR SPINE WO CONTRAST  Result Date: 11/09/2022 CLINICAL DATA:  Lumbar radiculopathy, symptoms persist with > 6 wks treatment EXAM: MRI LUMBAR SPINE WITHOUT CONTRAST TECHNIQUE: Multiplanar, multisequence MR imaging of the lumbar spine was performed. No intravenous contrast was administered. COMPARISON:  None Available. FINDINGS: Segmentation:  Standard. Alignment:  Grade 1 anterolisthesis of L4 on L5. Vertebrae:  No fracture, evidence of discitis, or bone lesion. Conus medullaris and cauda equina: Conus extends to the L1 level. Conus and cauda equina appear normal. Paraspinal and other soft tissues: Small exophytic renal cyst on the right. Disc levels: T12-L1: Unremarkable L1-L2: Unremarkable L2-L3: Mild bilateral facet degenerative change. Minimal disc bulge. No spinal canal narrowing. No neural foraminal narrowing. L3-L4: Mild bilateral facet degenerative change. Minimal disc bulge. No spinal canal narrowing. No neural foraminal narrowing. L4-L5: Central disc protrusion with an annular fissure. Moderate to severe bilateral facet degenerative change. Moderate left and mild-to-moderate right neural foraminal narrowing. There is mild narrowing of the right lateral recess. L5-S1: Mild bilateral facet degenerative change. Minimal disc bulge. No spinal canal  narrowing. No neural foraminal narrowing. IMPRESSION: Central disc protrusion with an annular fissure at L4-L5 resulting in mild spinal canal narrowing and narrowing of the right lateral recess. There is also moderate left and mild-to-moderate right neural foraminal narrowing at this level. Electronically Signed   By: Lorenza Cambridge M.D.   On: 11/09/2022 19:18   CT Angio Chest/Abd/Pel for Dissection W and/or Wo Contrast  Result Date: 11/07/2022 CLINICAL DATA:  Abdominal pain, back pain, left lower extremity pain EXAM: CT ANGIOGRAPHY CHEST, ABDOMEN AND PELVIS TECHNIQUE: Non-contrast CT of the chest was initially obtained. Multidetector CT imaging through the chest, abdomen and pelvis was performed using the standard protocol during bolus administration of intravenous contrast. Multiplanar reconstructed images and MIPs were obtained and reviewed to evaluate the vascular anatomy. RADIATION DOSE REDUCTION: This exam was performed according to the departmental dose-optimization program which includes automated exposure control, adjustment of the mA and/or kV according to patient size and/or use of iterative reconstruction technique. CONTRAST:  OMNIPAQUE IOHEXOL 350 MG/ML SOLN COMPARISON:  11/11/2021 FINDINGS: CTA CHEST FINDINGS Cardiovascular: Heart is normal size. Aorta is normal caliber. Moderate coronary artery and scattered aortic calcifications. No dissection. Mediastinum/Nodes: No  mediastinal, hilar, or axillary adenopathy. Trachea and esophagus are unremarkable. Thyroid unremarkable. Lungs/Pleura: No confluent opacities or effusions. Musculoskeletal: Chest wall soft tissues are unremarkable. No acute bony abnormality. Review of the MIP images confirms the above findings. CTA ABDOMEN AND PELVIS FINDINGS VASCULAR Aorta: Aortic atherosclerosis, most pronounced in the infrarenal aorta. No aneurysm or dissection. Celiac: Patent without evidence of aneurysm, dissection, vasculitis or significant stenosis. SMA:  Patent without evidence of aneurysm, dissection, vasculitis or significant stenosis. Renals: Both renal arteries are patent without evidence of aneurysm, dissection, vasculitis, fibromuscular dysplasia or significant stenosis. IMA: Patent without evidence of aneurysm, dissection, vasculitis or significant stenosis. Inflow: Atherosclerotic calcifications throughout the iliac vessels most pronounced in the common iliac arteries. No aneurysm, dissection or significant stenosis. Veins: No obvious venous abnormality within the limitations of this arterial phase study. Review of the MIP images confirms the above findings. NON-VASCULAR Hepatobiliary: No focal hepatic abnormality. Gallbladder unremarkable. Pancreas: No focal abnormality or ductal dilatation. Spleen: No focal abnormality.  Normal size. Adrenals/Urinary Tract: Adrenal glands are normal. 1.4 cm exophytic cyst off the posterior right midpole, stable since prior study. No hydronephrosis. Urinary bladder unremarkable. Stomach/Bowel: Normal appendix. Stomach, large and small bowel grossly unremarkable. Lymphatic: No adenopathy Reproductive: No visible focal abnormality. Other: No free fluid or free air. Musculoskeletal: No acute bony abnormality. Review of the MIP images confirms the above findings. IMPRESSION: No evidence of aortic aneurysm or dissection. Aortic atherosclerosis. Coronary artery disease. No acute findings in the chest, abdomen or pelvis. Electronically Signed   By: Charlett Nose M.D.   On: 11/07/2022 02:55   CT HEAD WO CONTRAST ( )  Result Date: 11/07/2022 CLINICAL DATA:  Delirium EXAM: CT HEAD WITHOUT CONTRAST TECHNIQUE: Contiguous axial images were obtained from the base of the skull through the vertex without intravenous contrast. RADIATION DOSE REDUCTION: This exam was performed according to the departmental dose-optimization program which includes automated exposure control, adjustment of the mA and/or kV according to patient size and/or  use of iterative reconstruction technique. COMPARISON:  08/05/2017 FINDINGS: Brain: No acute intracranial abnormality. Specifically, no hemorrhage, hydrocephalus, mass lesion, acute infarction, or significant intracranial injury. Vascular: No hyperdense vessel or unexpected calcification. Skull: No acute calvarial abnormality. Sinuses/Orbits: No acute findings Other: None IMPRESSION: No acute intracranial abnormality. Electronically Signed   By: Charlett Nose M.D.   On: 11/07/2022 02:51   DG Chest Port 1 View  Result Date: 11/07/2022 CLINICAL DATA:  Chest pain. EXAM: PORTABLE CHEST 1 VIEW COMPARISON:  Chest radiograph dated 04/27/2022. FINDINGS: No focal consolidation, pleural effusion, or pneumothorax. The cardiac silhouette is within normal limits. No acute osseous pathology. IMPRESSION: No active disease. Electronically Signed   By: Elgie Collard M.D.   On: 11/07/2022 02:21    Microbiology: Results for orders placed or performed during the hospital encounter of 11/07/22  MRSA Next Gen by PCR, Nasal     Status: None   Collection Time: 11/07/22  8:40 AM   Specimen: Nasal Mucosa; Nasal Swab  Result Value Ref Range Status   MRSA by PCR Next Gen NOT DETECTED NOT DETECTED Final    Comment: (NOTE) The GeneXpert MRSA Assay (FDA approved for NASAL specimens only), is one component of a comprehensive MRSA colonization surveillance program. It is not intended to diagnose MRSA infection nor to guide or monitor treatment for MRSA infections. Test performance is not FDA approved in patients less than 22 years old. Performed at [facility], [address], [place], [place] [address]     Labs: CBC:  Recent Labs  Lab 11/05/22 2158 11/07/22 0126 11/08/22 0438  WBC 7.8 7.9 5.6  NEUTROABS 6.3  --  3.1  HGB 12.9* 13.2 11.2*  HCT 40.0 41.5 33.9*  MCV 89.9 90.6 89.4  PLT 244 290 197   Basic Metabolic Panel: Recent Labs  Lab 11/07/22 0829 11/07/22 1117 11/07/22 1554 11/08/22 0438  11/09/22 0452  NA 137 139 137 135 135  K 4.4 4.4 3.9 3.8 3.6  CL 108 109 108 107 105  CO2 21* 20* 23 22 23   GLUCOSE 168* 184* 156* 166* 145*  BUN 21* 20 19 16 15   CREATININE 0.98 0.94 0.79 0.83 0.67  CALCIUM 8.7* 9.0 8.8* 8.6* 8.2*  MG 1.9  --   --  1.5*  --   PHOS 2.9  --   --   --   --    Liver Function Tests: Recent Labs  Lab 11/05/22 2158 11/07/22 0126 11/08/22 0438  AST 18 20 22   ALT 29 28 24   ALKPHOS 56 58 45  BILITOT 1.1 1.5* 1.0  PROT 7.1 7.6 6.0*  ALBUMIN 3.6 4.0 3.2*   CBG: Recent Labs  Lab 11/09/22 1104 11/09/22 1631 11/09/22 2041 11/10/22 0708 11/10/22 1125  GLUCAP 163* 207* 190* 161* 217*    Discharge time spent: greater than 30 minutes.  Signed: Vassie Loll, MD [facility] 11/10/2022

## 2022-12-09 ENCOUNTER — Telehealth: Payer: Self-pay | Admitting: Nurse Practitioner

## 2022-12-09 NOTE — Telephone Encounter (Signed)
Unfortunately, we are overbooked as it is, we cannot accommodate an earlier appointment at this time.  Cancellation list is the best option at this time.

## 2022-12-09 NOTE — Telephone Encounter (Signed)
Patient's PCP called and said they would like for him to be seen sooner due to low BS. I advised them that he was on our cancellation list and that it will notify him via mychart. She asked if a provider to provider would make a difference in getting him in sooner than oct 2. Please Advise.

## 2023-01-11 IMAGING — US US ABDOMEN COMPLETE
1 series · 14 of 25 positions shown · non-contrast
Comparison: None Available.

CLINICAL DATA: Upper abdominal pain

EXAM:
ABDOMEN ULTRASOUND COMPLETE

[Series 1: us abdomen complete · 0.17mm/px · 14 of 69 slices shown]
[im 1/69]
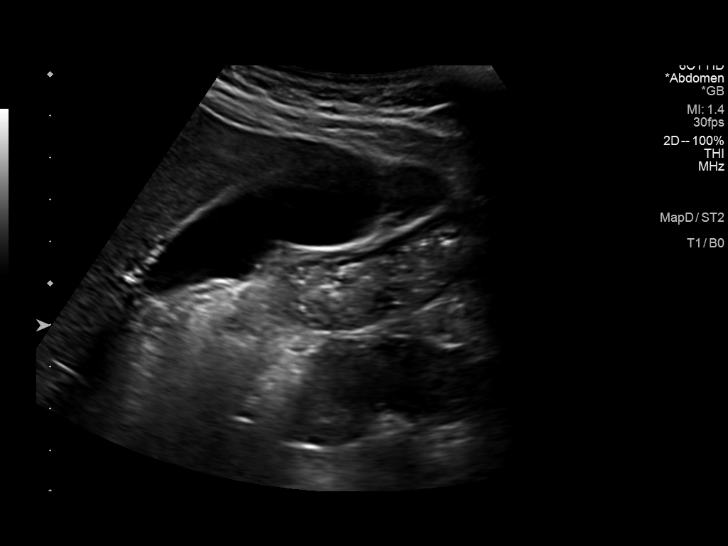
[im 6/69]
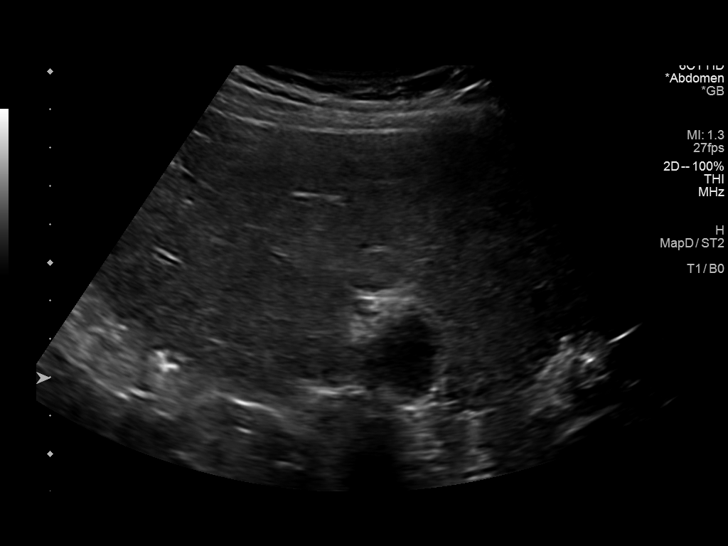
[im 12/69]
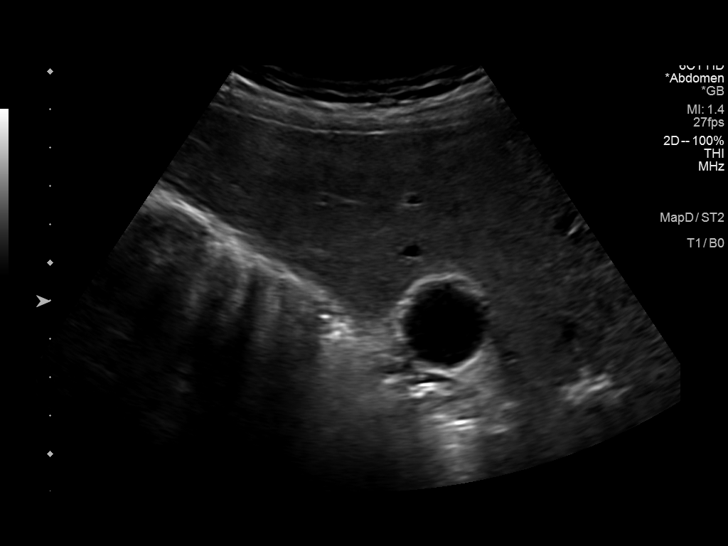
[im 18/69]
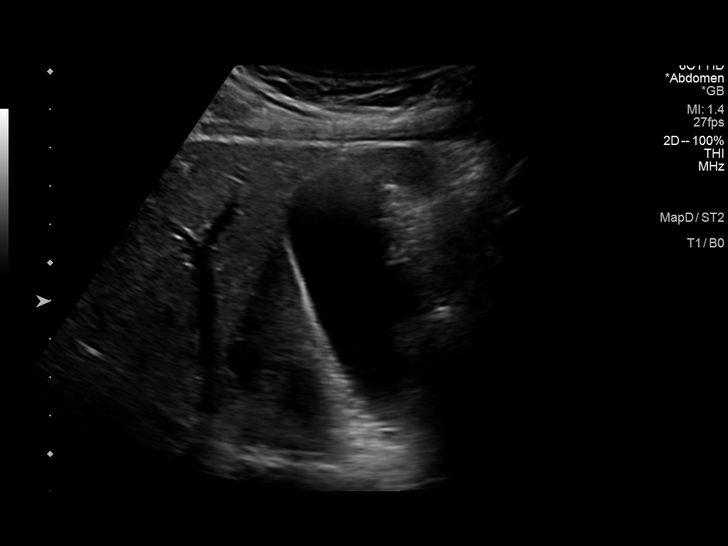
[im 23/69]
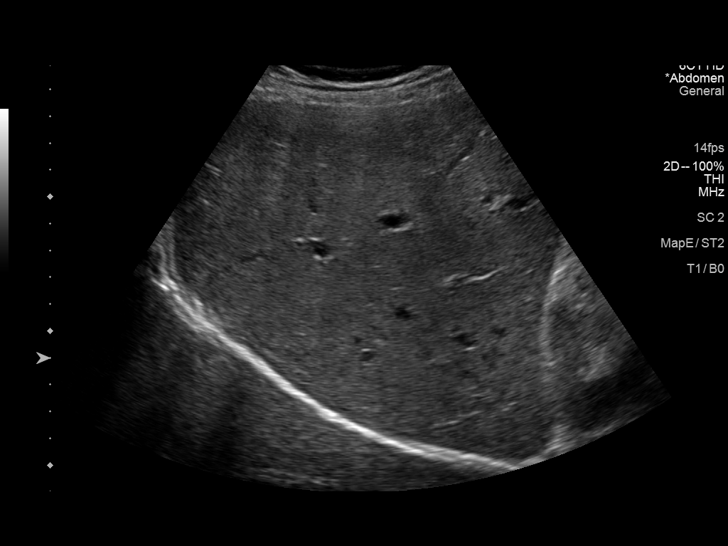
[im 26/69]
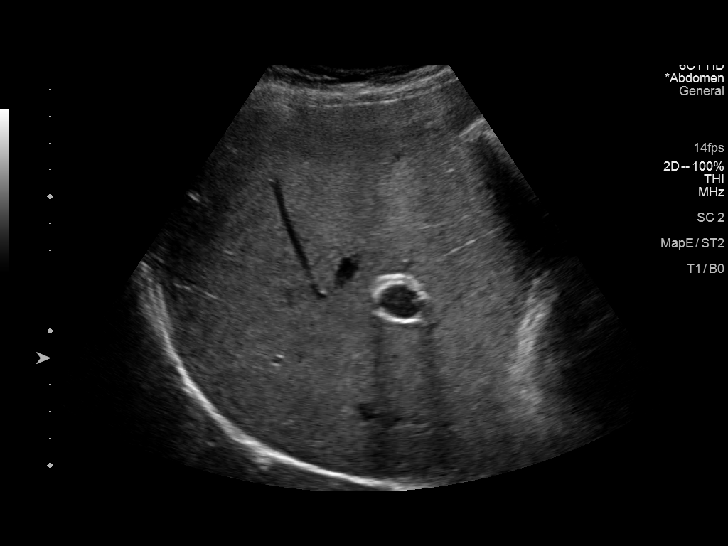
[im 32/69]
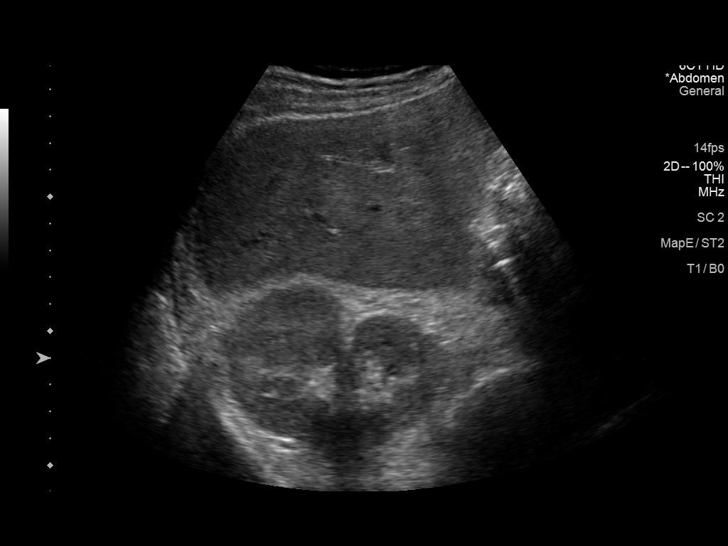
[im 37/69]
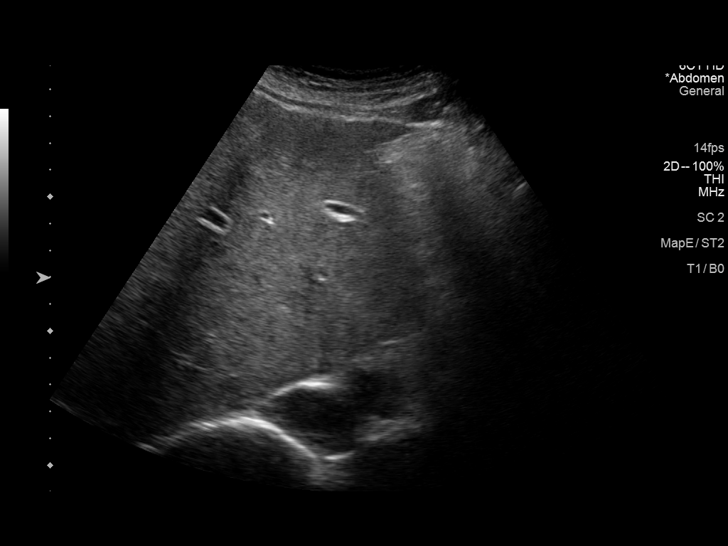
[im 43/69]
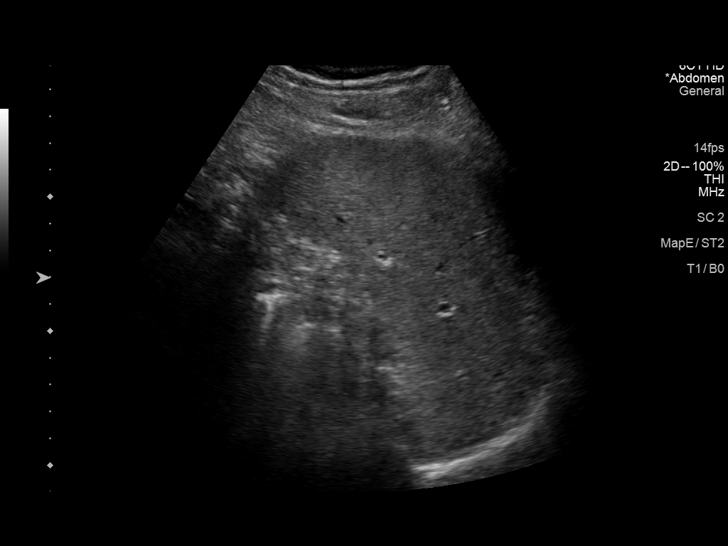
[im 46/69]
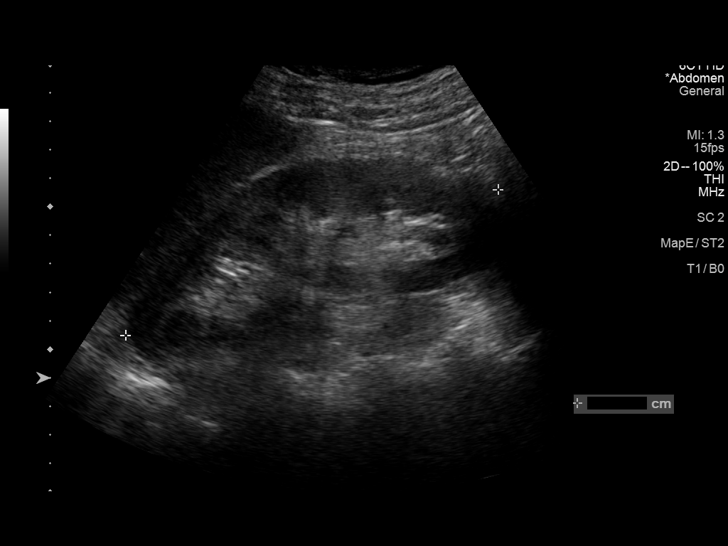
[im 52/69]
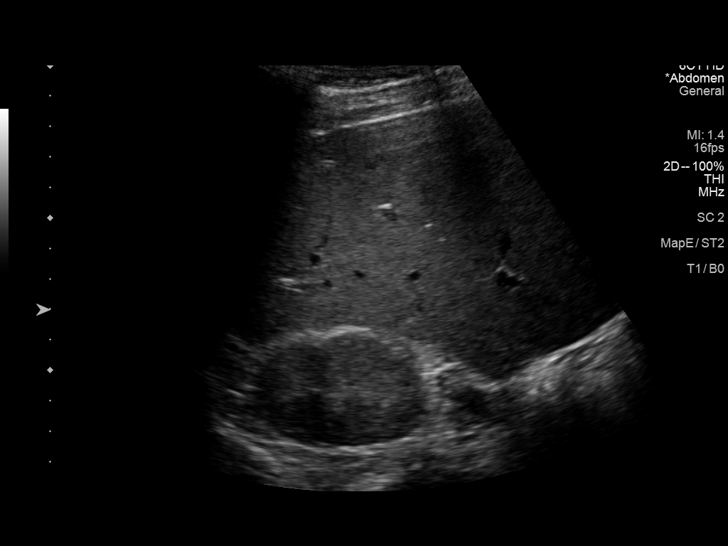
[im 57/69]
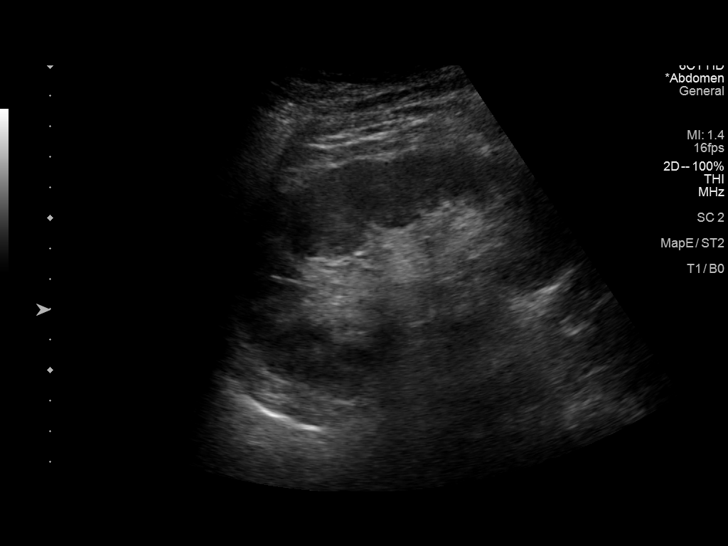
[im 63/69]
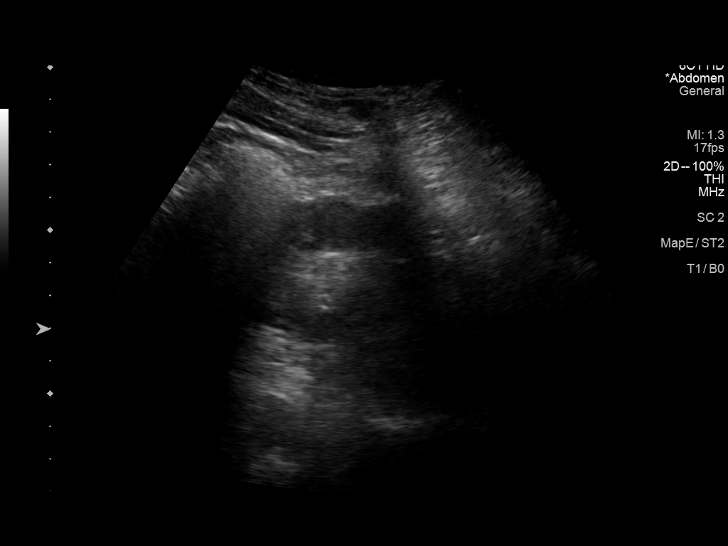
[im 69/69]
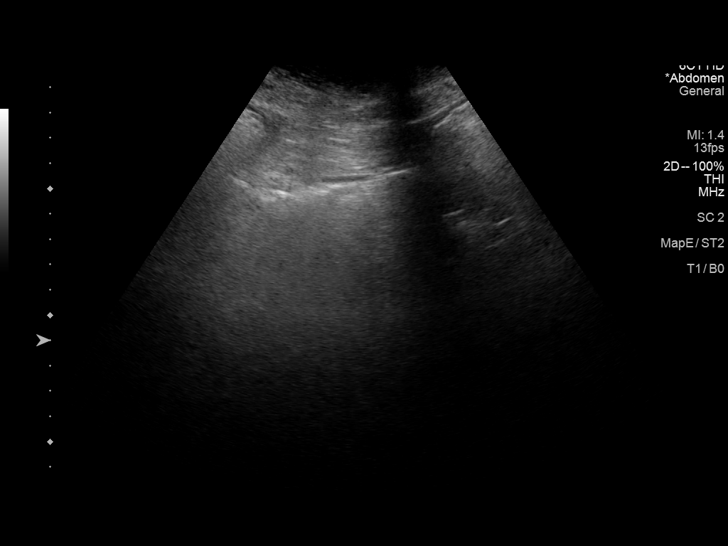

[14 of 25 positions shown; findings below may reference images not displayed]

FINDINGS: Gallbladder: No gallstones or wall thickening visualized. No
sonographic Murphy sign noted by sonographer.

Common bile duct: Diameter: 3 mm

Liver: No focal lesion identified. Within normal limits in
parenchymal echogenicity. Portal vein is patent on color Doppler
imaging with normal direction of blood flow towards the liver.

IVC: No abnormality visualized.

Pancreas: Not well visualized.

Spleen: Mildly enlarged measuring 13 cm in length.

Right Kidney: Length: 14.4 cm. Echogenicity within normal limits. No
mass or hydronephrosis visualized.

Left Kidney: Length: 12.3 cm. Echogenicity within normal limits. No
mass or hydronephrosis visualized.

Abdominal aorta: Not well visualized.

Other findings: Limited due to bowel gas.
IMPRESSION: 1. No acute abnormality identified.
2. Mild splenomegaly.

## 2023-01-12 ENCOUNTER — Encounter: Payer: Self-pay | Admitting: Nurse Practitioner

## 2023-01-12 ENCOUNTER — Ambulatory Visit (INDEPENDENT_AMBULATORY_CARE_PROVIDER_SITE_OTHER): Payer: Medicaid Other | Admitting: Nurse Practitioner

## 2023-01-12 VITALS — BP 117/65 | HR 73 | Ht 71.0 in | Wt 213.0 lb

## 2023-01-12 DIAGNOSIS — Z7984 Long term (current) use of oral hypoglycemic drugs: Secondary | ICD-10-CM | POA: Diagnosis not present

## 2023-01-12 DIAGNOSIS — E1165 Type 2 diabetes mellitus with hyperglycemia: Secondary | ICD-10-CM

## 2023-01-12 DIAGNOSIS — Z794 Long term (current) use of insulin: Secondary | ICD-10-CM

## 2023-01-12 MED ORDER — FREESTYLE LIBRE 2 SENSOR MISC: 3 refills | Status: DC

## 2023-01-12 MED ORDER — CAREFINE PEN NEEDLES 32G X 6 MM MISC: 6 refills | Status: DC

## 2023-01-12 MED ORDER — HUMULIN 70/30 KWIKPEN (70-30) 100 UNIT/ML ~~LOC~~ SUPN: 25.0000 [IU] | PEN_INJECTOR | Freq: Two times a day (BID) | SUBCUTANEOUS | 3 refills | Status: DC

## 2023-01-12 MED ORDER — JANUVIA 100 MG PO TABS: 100.0000 mg | ORAL_TABLET | Freq: Every day | ORAL | 3 refills | Status: AC

## 2023-01-12 MED ORDER — METFORMIN HCL 1000 MG PO TABS: 1000.0000 mg | ORAL_TABLET | Freq: Two times a day (BID) | ORAL | 3 refills | Status: DC

## 2023-01-12 NOTE — Progress Notes (Unsigned)
Endocrinology Consult Note       01/13/2023, 10:10 AM   Subjective:    Patient ID: Jeff Day, male    DOB: 01-Oct-1965.  Jeff Day is being seen in consultation for management of currently uncontrolled symptomatic diabetes requested by  Sheliah Hatch, PA-C.   Past Medical History:  Diagnosis Date   Back pain    Depression with anxiety    Diabetes (HCC)    GERD (gastroesophageal reflux disease)    Headache    Hyperlipemia    Hypertension    Wears dentures     Past Surgical History:  Procedure Laterality Date   CARDIAC CATHETERIZATION     Normal   CARPAL TUNNEL RELEASE     COLONOSCOPY     HAND SURGERY Right    2006    Social History   Socioeconomic History   Marital status: Widowed    Spouse name: Not on file   Number of children: 0   Years of education: 9th grade   Highest education level: Not on file  Occupational History   Occupation: [employer] doctor  Tobacco Use   Smoking status: Former   Smokeless tobacco: Former   Tobacco comments:    Quit 2001  Vaping Use   Vaping status: Every Day   Substances: Nicotine  Substance and Sexual Activity   Alcohol use: Yes    Alcohol/week: 0.0 standard drinks of alcohol    Comment: 4 per month   Drug use: No   Sexual activity: Not on file  Other Topics Concern   Not on file  Social History Narrative   Lives at home with friend.   Three cups caffeine per day.   Right-handed.   Social Determinants of Health   Financial Resource Strain: Not on file  Food Insecurity: Not on file  Transportation Needs: Not on file  Physical Activity: Not on file  Stress: Not on file  Social Connections: Unknown (09/01/2021)   Received from [facility], [facility]   Social Network    Social Network: Not on file    Family History  Problem Relation Age of Onset   Obesity Mother    Other Mother        meningitis   Cirrhosis Father     Alcoholism Father    Diabetes Sister    Diabetes Paternal Grandfather    Diabetes Maternal Grandfather    Suicidality Brother     Outpatient Encounter Medications as of 01/12/2023  Medication Sig   ACCU-CHEK AVIVA PLUS test strip USE AS DIRECTED   buPROPion (WELLBUTRIN XL) 150 MG 24 hr tablet Take 150 mg by mouth daily.   escitalopram (LEXAPRO) 20 MG tablet Take 20 mg by mouth daily.   gabapentin (NEURONTIN) 300 MG capsule Take 1 capsule (300 mg total) by mouth 3 (three) times daily.   Insulin Pen Needle (CAREFINE PEN NEEDLES) 32G X 6 MM MISC Use to inject insulin twice daily   levocetirizine (XYZAL) 5 MG tablet Take 5 mg by mouth every evening.   lisinopril (ZESTRIL) 10 MG tablet Take 10 mg by mouth daily.   montelukast (SINGULAIR) 10 MG tablet Take 10 mg by mouth daily.  pantoprazole (PROTONIX) 40 MG tablet Take 1 tablet (40 mg total) by mouth 2 (two) times daily.   rosuvastatin (CRESTOR) 20 MG tablet Take 20 mg by mouth daily.   valACYclovir (VALTREX) 500 MG tablet Take 500 mg by mouth as needed.   [DISCONTINUED] Continuous Glucose Sensor (FREESTYLE LIBRE 2 SENSOR) MISC USE TO MONITOR BLOOD SUGAR CONTINUOUSLY. CHANGE SENSOR EVERY 14 DAYS.   [DISCONTINUED] HUMULIN 70/30 KWIKPEN (70-30) 100 UNIT/ML KwikPen Inject 50 Units into the skin 2 (two) times daily.   [DISCONTINUED] JANUVIA 100 MG tablet Take 1 tablet by mouth daily.   [DISCONTINUED] metFORMIN (GLUCOPHAGE) 1000 MG tablet Take 1,000 mg by mouth 2 (two) times daily with a meal.   Continuous Glucose Sensor (FREESTYLE LIBRE 2 SENSOR) MISC Use to check glucose continuously. Change sensor every 14 days.   HUMULIN 70/30 KWIKPEN (70-30) 100 UNIT/ML KwikPen Inject 25 Units into the skin 2 (two) times daily.   JANUVIA 100 MG tablet Take 1 tablet (100 mg total) by mouth daily.   metFORMIN (GLUCOPHAGE) 1000 MG tablet Take 1 tablet (1,000 mg total) by mouth 2 (two) times daily with a meal.   [DISCONTINUED] methocarbamol (ROBAXIN-750) 750 MG  tablet Take 1 tablet (750 mg total) by mouth every 8 (eight) hours as needed for muscle spasms.   [DISCONTINUED] oxyCODONE (OXY IR/ROXICODONE) 5 MG immediate release tablet Take 1 tablet (5 mg total) by mouth every 6 (six) hours as needed for severe pain.   [DISCONTINUED] sucralfate (CARAFATE) 1 g tablet Take 1 tablet (1 g total) by mouth 4 (four) times daily as needed.   No facility-administered encounter medications on file as of 01/12/2023.    ALLERGIES: No Known Allergies  VACCINATION STATUS:  There is no immunization history on file for this patient.  Diabetes He presents for his initial diabetic visit. He has type 2 diabetes mellitus. Onset time: diagnosed at approx age of 2. His disease course has been stable. Hypoglycemia symptoms include nervousness/anxiousness, sweats and tremors. Associated symptoms include fatigue, foot paresthesias, polydipsia, polyuria and visual change. There are no hypoglycemic complications. Symptoms are stable. Diabetic complications include nephropathy, peripheral neuropathy and retinopathy. (DM foot ulcers) Risk factors for coronary artery disease include diabetes mellitus, dyslipidemia, family history, male sex, hypertension, tobacco exposure and sedentary lifestyle. Current diabetic treatment includes insulin injections and oral agent (triple therapy) (Currently on premixed insulin BID, Januvia 100 mg daily, Metformin 1000 mg BID, and Farxiga 10 mg daily). He is compliant with treatment most of the time. His weight is stable. He is following a generally unhealthy diet. When asked about meal planning, he reported none. He has not had a previous visit with a dietitian. He participates in exercise intermittently. His home blood glucose trend is decreasing steadily. His overall blood glucose range is 140-180 mg/dl. (He presents today for his consultation with his CGM showing limited data (recently applied sensor 2 days ago), showing mostly at goal glycemic profile.   His most recent A1c on 7/20 was 12.9%.  He drinks coffee with sweetener, flavored water, and diet Mt Dew.  He eats 2-3 meals per day (sometimes skipping lunch) and does occasionally snack on fruits.  He stays pretty active.  He is UTD on eye exam, has never seen podiatry in the past.  ) An ACE inhibitor/angiotensin II receptor blocker is being taken. He does not see a podiatrist.Eye exam is current.     Review of systems  Constitutional: + Minimally fluctuating body weight, current Body mass index is 29.71 kg/m., no  fatigue, no subjective hyperthermia, no subjective hypothermia Eyes: no blurry vision, no xerophthalmia ENT: no sore throat, no nodules palpated in throat, no dysphagia/odynophagia, no hoarseness Cardiovascular: no chest pain, no shortness of breath, no palpitations, no leg swelling Respiratory: no cough, no shortness of breath Gastrointestinal: no nausea/vomiting/diarrhea Musculoskeletal: no muscle/joint aches Skin: no rashes, no hyperemia Neurological: no tremors, no numbness, no tingling, no dizziness Psychiatric: no depression, no anxiety  Objective:     BP 117/65 (BP Location: Left Arm, Patient Position: Sitting, Cuff Size: Large)   Pulse 73   Ht 5\' 11"  (1.803 m)   Wt 213 lb (96.6 kg)   BMI 29.71 kg/m   Wt Readings from Last 3 Encounters:  01/12/23 213 lb (96.6 kg)  11/08/22 208 lb 8.9 oz (94.6 kg)  11/05/22 220 lb 0.3 oz (99.8 kg)     BP Readings from Last 3 Encounters:  01/12/23 117/65  11/10/22 (!) 145/65  11/05/22 (!) 147/72     Physical Exam- Limited  Constitutional:  Body mass index is 29.71 kg/m. , not in acute distress, normal state of mind Eyes:  EOMI, no exophthalmos Neck: Supple Cardiovascular: RRR, no murmurs, rubs, or gallops, no edema Respiratory: Adequate breathing efforts, no crackles, rales, rhonchi, or wheezing Musculoskeletal: no gross deformities, strength intact in all four extremities, no gross restriction of joint movements Skin:   no rashes, no hyperemia Neurological: no tremor with outstretched hands   Diabetic Foot Exam - Simple   No data filed     CMP ( most recent) CMP     Component Value Date/Time   NA 135 11/09/2022 0452   K 3.6 11/09/2022 0452   CL 105 11/09/2022 0452   CO2 23 11/09/2022 0452   GLUCOSE 145 (H) 11/09/2022 0452   BUN 15 11/09/2022 0452   BUN 21 10/26/2022 0000   CREATININE 0.67 11/09/2022 0452   CALCIUM 8.2 (L) 11/09/2022 0452   PROT 6.0 (L) 11/08/2022 0438   ALBUMIN 3.2 (L) 11/08/2022 0438   AST 22 11/08/2022 0438   ALT 24 11/08/2022 0438   ALKPHOS 45 11/08/2022 0438   BILITOT 1.0 11/08/2022 0438   EGFR 63 10/26/2022 0000   GFRNONAA >60 11/09/2022 0452     Diabetic Labs (most recent): Lab Results  Component Value Date   HGBA1C 12.9 (H) 11/07/2022   HGBA1C 13.3 10/26/2022     Lipid Panel ( most recent) Lipid Panel     Component Value Date/Time   TRIG 323 (A) 10/26/2022 0000   LDLCALC 29 10/26/2022 0000      Lab Results  Component Value Date   TSH 1.653 11/08/2022           Assessment & Plan:   1) Type 2 Diabetes with hyperglycemia with long-term current use of insulin  He presents today for his consultation with his CGM showing limited data (recently applied sensor 2 days ago), showing mostly at goal glycemic profile.  His most recent A1c on 7/20 was 12.9%.  He drinks coffee with sweetener, flavored water, and diet Mt Dew.  He eats 2-3 meals per day (sometimes skipping lunch) and does occasionally snack on fruits.  He stays pretty active.  He is UTD on eye exam, has never seen podiatry in the past.    - KEIONDRE GLOE has currently uncontrolled symptomatic type 2 DM since 57 years of age, with most recent A1c of 12.9 %.   -Recent labs reviewed.  - I had a long discussion with him about the  progressive nature of diabetes and the pathology behind its complications. -his diabetes is complicated by CKD stage 2, DM foot ulcers and he remains at a high risk  for more acute and chronic complications which include CAD, CVA, CKD, retinopathy, and neuropathy. These are all discussed in detail with him.  The following Lifestyle Medicine recommendations according to American College of Lifestyle Medicine [facility]) were discussed and offered to patient and he agrees to start the journey:  A. Whole Foods, Plant-based plate comprising of fruits and vegetables, plant-based proteins, whole-grain carbohydrates was discussed in detail with the patient.   A list for source of those nutrients were also provided to the patient.  Patient will use only water or unsweetened tea for hydration. B.  The need to stay away from risky substances including alcohol, smoking; obtaining 7 to 9 hours of restorative sleep, at least 150 minutes of moderate intensity exercise weekly, the importance of healthy social connections,  and stress reduction techniques were discussed. C.  A full color page of  Calorie density of various food groups per pound showing examples of each food groups was provided to the patient.  - I have counseled him on diet and weight management by adopting a carbohydrate restricted/protein rich diet. Patient is encouraged to switch to unprocessed or minimally processed complex starch and increased protein intake (animal or plant source), fruits, and vegetables. -  he is advised to stick to a routine mealtimes to eat 3 meals a day and avoid unnecessary snacks (to snack only to correct hypoglycemia).   - he acknowledges that there is a room for improvement in his food and drink choices. - Suggestion is made for him to avoid simple carbohydrates from his diet including Cakes, Sweet Desserts, Ice Cream, Soda (diet and regular), Sweet Tea, Candies, Chips, Cookies, Store Bought Juices, Alcohol in Excess of 1-2 drinks a day, Artificial Sweeteners, Coffee Creamer, and "Sugar-free" Products. This will help patient to have more stable blood glucose profile and potentially avoid  unintended weight gain.  - I have approached him with the following individualized plan to manage his diabetes and patient agrees:   -He is advised to lower his premixed insulin (70/30) to 25 units BID with meals if glucose is above 90 and he is eating.  Hoping this will help prevent dropping too low and causing him to correct which he as a tendency to over-correct.   -he is encouraged to start monitoring glucose 4 times daily (using his CGM), before meals and before bed, to log their readings on the clinic sheets provided, and bring them to review at follow up appointment in 3 months.  - he is warned not to take insulin without proper monitoring per orders. - Adjustment parameters are given to him for hypo and hyperglycemia in writing. - he is encouraged to call clinic for blood glucose levels less than 70 or above 300 mg /dl. - he is advised to continue Metformin 1000 mg po twice daily and Januvia 100 mg po daily, therapeutically suitable for patient . - his Marcelline Deist will be discontinued, to de-escalate treatment plan and help control cost.  - Specific targets for  A1c; LDL, HDL, and Triglycerides were discussed with the patient.  2) Blood Pressure /Hypertension:  his blood pressure is controlled to target.   he is advised to continue his current medications including Lisinopril 10 mg p.o. daily with breakfast.  3) Lipids/Hyperlipidemia:    Review of his recent lipid panel from 10/26/22 showed controlled LDL at  29 and elevated triglycerides of 323 .  he is advised to continue Crestor 20 mg daily at bedtime.  Side effects and precautions discussed with him.  4)  Weight/Diet:  his Body mass index is 29.71 kg/m.  -    he is NOT a candidate for weight loss.  Exercise, and detailed carbohydrates information provided  -  detailed on discharge instructions.  5) Chronic Care/Health Maintenance: -he is on ACEI/ARB and Statin medications and is encouraged to initiate and continue to follow up with  Ophthalmology, Dentist, Podiatrist at least yearly or according to recommendations, and advised to stay away from smoking. I have recommended yearly flu vaccine and pneumonia vaccine at least every 5 years; moderate intensity exercise for up to 150 minutes weekly; and sleep for at least 7 hours a day.  - he is advised to maintain close follow up with Sheliah Hatch, PA-C for primary care needs, as well as his other providers for optimal and coordinated care.   - Time spent in this patient care: 60 min, of which > 50% was spent in counseling him about his diabetes and the rest reviewing his blood glucose logs, discussing his hypoglycemia and hyperglycemia episodes, reviewing his current and previous labs/studies (including abstraction from other facilities) and medications doses and developing a long term treatment plan based on the latest standards of care/guidelines; and documenting his care.    Please refer to Patient Instructions for Blood Glucose Monitoring and Insulin/Medications Dosing Guide" in media tab for additional information. Please also refer to "Patient Self Inventory" in the Media tab for reviewed elements of pertinent patient history.  Jeff Day participated in the discussions, expressed understanding, and voiced agreement with the above plans.  All questions were answered to his satisfaction. he is encouraged to contact clinic should he have any questions or concerns prior to his return visit.     Follow up plan: - Return in about 3 months (around 04/13/2023) for Diabetes F/U with A1c in office, No previsit labs, Bring meter and logs.    Ronny Bacon, [facility] [address] [place], [place] [address] Phone: [phone] Fax: [phone]  01/13/2023, 10:10 AM

## 2023-01-20 ENCOUNTER — Ambulatory Visit: Payer: Self-pay | Admitting: Nurse Practitioner

## 2023-04-23 ENCOUNTER — Encounter: Payer: Medicaid Other | Admitting: Nurse Practitioner

## 2023-04-23 DIAGNOSIS — Z794 Long term (current) use of insulin: Secondary | ICD-10-CM

## 2023-04-23 DIAGNOSIS — Z7984 Long term (current) use of oral hypoglycemic drugs: Secondary | ICD-10-CM

## 2023-04-23 NOTE — Progress Notes (Signed)
 Erroneous encounter

## 2023-04-27 ENCOUNTER — Other Ambulatory Visit (HOSPITAL_COMMUNITY): Payer: Self-pay | Admitting: Family Medicine

## 2023-04-27 DIAGNOSIS — R109 Unspecified abdominal pain: Secondary | ICD-10-CM

## 2023-04-29 ENCOUNTER — Ambulatory Visit: Payer: Medicaid Other | Admitting: Nurse Practitioner

## 2023-05-03 ENCOUNTER — Other Ambulatory Visit (HOSPITAL_COMMUNITY): Payer: Medicaid Other

## 2023-05-05 ENCOUNTER — Ambulatory Visit (HOSPITAL_COMMUNITY)
Admission: RE | Admit: 2023-05-05 | Discharge: 2023-05-05 | Disposition: A | Discharge status: Home / Self Care | Payer: Medicaid Other | Source: Ambulatory Visit | Attending: Family Medicine | Admitting: Family Medicine

## 2023-05-05 DIAGNOSIS — R109 Unspecified abdominal pain: Secondary | ICD-10-CM | POA: Insufficient documentation

## 2023-05-07 ENCOUNTER — Ambulatory Visit (INDEPENDENT_AMBULATORY_CARE_PROVIDER_SITE_OTHER): Payer: Medicaid Other | Admitting: Nurse Practitioner

## 2023-05-07 ENCOUNTER — Encounter: Payer: Self-pay | Admitting: Nurse Practitioner

## 2023-05-07 VITALS — BP 130/70 | HR 80 | Ht 71.0 in | Wt 213.6 lb

## 2023-05-07 DIAGNOSIS — Z7984 Long term (current) use of oral hypoglycemic drugs: Secondary | ICD-10-CM

## 2023-05-07 DIAGNOSIS — E782 Mixed hyperlipidemia: Secondary | ICD-10-CM | POA: Diagnosis not present

## 2023-05-07 DIAGNOSIS — Z794 Long term (current) use of insulin: Secondary | ICD-10-CM | POA: Diagnosis not present

## 2023-05-07 DIAGNOSIS — E1165 Type 2 diabetes mellitus with hyperglycemia: Secondary | ICD-10-CM

## 2023-05-07 DIAGNOSIS — I10 Essential (primary) hypertension: Secondary | ICD-10-CM

## 2023-05-07 LAB — POCT GLYCOSYLATED HEMOGLOBIN (HGB A1C): Hemoglobin A1C: 8.4 % — AB (ref 4.0–5.6)

## 2023-05-07 MED ORDER — HUMULIN 70/30 KWIKPEN (70-30) 100 UNIT/ML ~~LOC~~ SUPN: 30.0000 [IU] | PEN_INJECTOR | Freq: Two times a day (BID) | SUBCUTANEOUS | 3 refills | Status: DC

## 2023-05-07 NOTE — Progress Notes (Signed)
Endocrinology Follow Up Note       05/07/2023, 11:22 AM   Subjective:    Patient ID: Jeff Day, male    DOB: [birth year].  Jeff Day is being seen in follow up after being seen in consultation for management of currently uncontrolled symptomatic diabetes requested by  Sheliah Hatch, PA-C.   Past Medical History:  Diagnosis Date   Back pain    Depression with anxiety    Diabetes (HCC)    GERD (gastroesophageal reflux disease)    Headache    Hyperlipemia    Hypertension    Wears dentures     Past Surgical History:  Procedure Laterality Date   CARDIAC CATHETERIZATION     Normal   CARPAL TUNNEL RELEASE     COLONOSCOPY     HAND SURGERY Right    2006    Social History   Socioeconomic History   Marital status: Widowed    Spouse name: Not on file   Number of children: 0   Years of education: 9th grade   Highest education level: Not on file  Occupational History   Occupation: [employer] doctor  Tobacco Use   Smoking status: Former   Smokeless tobacco: Former   Tobacco comments:    Quit 2001  Vaping Use   Vaping status: Every Day   Substances: Nicotine  Substance and Sexual Activity   Alcohol use: Yes    Alcohol/week: 0.0 standard drinks of alcohol    Comment: 4 per month   Drug use: No   Sexual activity: Not on file  Other Topics Concern   Not on file  Social History Narrative   Lives at home with friend.   Three cups caffeine per day.   Right-handed.   Social Drivers of Corporate investment banker Strain: Not on file  Food Insecurity: Not on file  Transportation Needs: Not on file  Physical Activity: Not on file  Stress: Not on file  Social Connections: Unknown (09/01/2021)   Received from [facility], [facility]   Social Network    Social Network: Not on file    Family History  Problem Relation Age of Onset   Obesity Mother    Other Mother        meningitis    Cirrhosis Father    Alcoholism Father    Diabetes Sister    Diabetes Paternal Grandfather    Diabetes Maternal Grandfather    Suicidality Brother     Outpatient Encounter Medications as of 05/07/2023  Medication Sig   ACCU-CHEK AVIVA PLUS test strip USE AS DIRECTED   buPROPion (WELLBUTRIN XL) 150 MG 24 hr tablet Take 150 mg by mouth daily.   escitalopram (LEXAPRO) 20 MG tablet Take 20 mg by mouth daily.   gabapentin (NEURONTIN) 300 MG capsule Take 1 capsule (300 mg total) by mouth 3 (three) times daily.   HYDROcodone-acetaminophen (NORCO/VICODIN) 5-325 MG tablet Take 1 tablet by mouth every 6 (six) hours as needed.   Insulin Pen Needle (CAREFINE PEN NEEDLES) 32G X 6 MM MISC Use to inject insulin twice daily   JANUVIA 100 MG tablet Take 1 tablet (100 mg total) by mouth daily.  levocetirizine (XYZAL) 5 MG tablet Take 5 mg by mouth every evening.   lisinopril (ZESTRIL) 10 MG tablet Take 10 mg by mouth daily.   meloxicam (MOBIC) 7.5 MG tablet Take 1 tablet by mouth daily.   metFORMIN (GLUCOPHAGE) 1000 MG tablet Take 1 tablet (1,000 mg total) by mouth 2 (two) times daily with a meal.   montelukast (SINGULAIR) 10 MG tablet Take 10 mg by mouth daily.   pantoprazole (PROTONIX) 40 MG tablet Take 1 tablet (40 mg total) by mouth 2 (two) times daily.   rosuvastatin (CRESTOR) 20 MG tablet Take 20 mg by mouth daily.   valACYclovir (VALTREX) 500 MG tablet Take 500 mg by mouth as needed.   [DISCONTINUED] HUMULIN 70/30 KWIKPEN (70-30) 100 UNIT/ML KwikPen Inject 25 Units into the skin 2 (two) times daily.   Continuous Glucose Sensor (FREESTYLE LIBRE 2 SENSOR) MISC Use to check glucose continuously. Change sensor every 14 days. (Patient not taking: Reported on 05/07/2023)   HUMULIN 70/30 KWIKPEN (70-30) 100 UNIT/ML KwikPen Inject 30 Units into the skin 2 (two) times daily.   No facility-administered encounter medications on file as of 05/07/2023.    ALLERGIES: No Known Allergies  VACCINATION  STATUS:  There is no immunization history on file for this patient.  Diabetes He presents for his follow-up diabetic visit. He has type 2 diabetes mellitus. Onset time: diagnosed at approx age of 34. His disease course has been fluctuating. Hypoglycemia symptoms include nervousness/anxiousness, sweats and tremors. Associated symptoms include fatigue, foot paresthesias, polydipsia and visual change. Pertinent negatives for diabetes include no polyuria. There are no hypoglycemic complications. Symptoms are stable. Diabetic complications include nephropathy, peripheral neuropathy and retinopathy. (DM foot ulcers) Risk factors for coronary artery disease include diabetes mellitus, dyslipidemia, family history, male sex, hypertension, tobacco exposure and sedentary lifestyle. Current diabetic treatment includes insulin injections and oral agent (dual therapy). He is compliant with treatment most of the time. His weight is stable. He is following a generally unhealthy diet. When asked about meal planning, he reported none. He has not had a previous visit with a dietitian. He participates in exercise intermittently. His home blood glucose trend is fluctuating dramatically. His overall blood glucose range is >200 mg/dl. (He presents today with his meter showing fluctuating glycemic profile with glucose ranging between 68-456.  He notes he has carpal tunnel surgery yesterday and did not take his medications, thus the high glucose shortly after.  He still has some drops in glucose mid-day as a result of not eating on time.  His POCT A1c today is 8.4%, improving from last visit of 12.9%.  He notes his PCP did an A1c on November and it was 6%.  Analysis of his meter shows 7-day average of 211, 14-day average of 201, 30-day average of 188, 90-day average of 188.  He notes he will be replacing his Libre sensor today.) An ACE inhibitor/angiotensin II receptor blocker is being taken. He does not see a podiatrist.Eye exam is  current.   Review of systems  Constitutional: + Minimally fluctuating body weight,  current Body mass index is 29.79 kg/m. , no fatigue, no subjective hyperthermia, no subjective hypothermia Eyes: no blurry vision, no xerophthalmia ENT: no sore throat, no nodules palpated in throat, no dysphagia/odynophagia, no hoarseness Cardiovascular: no chest pain, no shortness of breath, no palpitations, no leg swelling Respiratory: no cough, no shortness of breath Gastrointestinal: no nausea/vomiting/diarrhea Musculoskeletal: no muscle/joint aches Skin: no rashes, no hyperemia Neurological: no tremors, no numbness, no tingling, no dizziness  Psychiatric: no depression, no anxiety  Objective:     BP 130/70 (BP Location: Right Arm, Patient Position: Sitting, Cuff Size: Large)   Pulse 80   Ht 5\' 11"  (1.803 m)   Wt 213 lb 9.6 oz (96.9 kg)   BMI 29.79 kg/m   Wt Readings from Last 3 Encounters:  05/07/23 213 lb 9.6 oz (96.9 kg)  01/12/23 213 lb (96.6 kg)  11/08/22 208 lb 8.9 oz (94.6 kg)     BP Readings from Last 3 Encounters:  05/07/23 130/70  01/12/23 117/65  11/10/22 (!) 145/65     Physical Exam- Limited  Constitutional:  Body mass index is 29.79 kg/m. , not in acute distress, normal state of mind Eyes:  EOMI, no exophthalmos Musculoskeletal: no gross deformities, strength intact in all four extremities, no gross restriction of joint movements Skin:  no rashes, no hyperemia Neurological: no tremor with outstretched hands   Diabetic Foot Exam - Simple   No data filed     CMP ( most recent) CMP     Component Value Date/Time   NA 135 11/09/2022 0452   K 3.6 11/09/2022 0452   CL 105 11/09/2022 0452   CO2 23 11/09/2022 0452   GLUCOSE 145 (H) 11/09/2022 0452   BUN 15 11/09/2022 0452   BUN 21 10/26/2022 0000   CREATININE 0.67 11/09/2022 0452   CALCIUM 8.2 (L) 11/09/2022 0452   PROT 6.0 (L) 11/08/2022 0438   ALBUMIN 3.2 (L) 11/08/2022 0438   AST 22 11/08/2022 0438   ALT  24 11/08/2022 0438   ALKPHOS 45 11/08/2022 0438   BILITOT 1.0 11/08/2022 0438   EGFR 63 10/26/2022 0000   GFRNONAA >60 11/09/2022 0452     Diabetic Labs (most recent): Lab Results  Component Value Date   HGBA1C 8.4 (A) 05/07/2023   HGBA1C 12.9 (H) 11/07/2022   HGBA1C 13.3 10/26/2022     Lipid Panel ( most recent) Lipid Panel     Component Value Date/Time   TRIG 323 (A) 10/26/2022 0000   LDLCALC 29 10/26/2022 0000      Lab Results  Component Value Date   TSH 1.653 11/08/2022           Assessment & Plan:   1) Type 2 Diabetes with hyperglycemia with long-term current use of insulin  He presents today with his meter showing fluctuating glycemic profile with glucose ranging between 68-456.  He notes he has carpal tunnel surgery yesterday and did not take his medications, thus the high glucose shortly after.  He still has some drops in glucose mid-day as a result of not eating on time.  His POCT A1c today is 8.4%, improving from last visit of 12.9%.  He notes his PCP did an A1c on November and it was 6%.  Analysis of his meter shows 7-day average of 211, 14-day average of 201, 30-day average of 188, 90-day average of 188.  He notes he will be replacing his [place] sensor today.  - Jeff Day has currently uncontrolled symptomatic type 2 DM since 58 years of age.   -Recent labs reviewed.  - I had a long discussion with him about the progressive nature of diabetes and the pathology behind its complications. -his diabetes is complicated by CKD stage 2, DM foot ulcers and he remains at a high risk for more acute and chronic complications which include CAD, CVA, CKD, retinopathy, and neuropathy. These are all discussed in detail with him.  The following Lifestyle Medicine recommendations according  to [employer] of Lifestyle Medicine [facility]) were discussed and offered to patient and he agrees to start the journey:  A. Whole Foods, Plant-based plate comprising of fruits and  vegetables, plant-based proteins, whole-grain carbohydrates was discussed in detail with the patient.   A list for source of those nutrients were also provided to the patient.  Patient will use only water or unsweetened tea for hydration. B.  The need to stay away from risky substances including alcohol, smoking; obtaining 7 to 9 hours of restorative sleep, at least 150 minutes of moderate intensity exercise weekly, the importance of healthy social connections,  and stress reduction techniques were discussed. C.  A full color page of  Calorie density of various food groups per pound showing examples of each food groups was provided to the patient.  - Nutritional counseling repeated at each appointment due to patients tendency to fall back in to old habits.  - The patient admits there is a room for improvement in their diet and drink choices. -  Suggestion is made for the patient to avoid simple carbohydrates from their diet including Cakes, Sweet Desserts / Pastries, Ice Cream, Soda (diet and regular), Sweet Tea, Candies, Chips, Cookies, Sweet Pastries, Store Bought Juices, Alcohol in Excess of 1-2 drinks a day, Artificial Sweeteners, Coffee Creamer, and "Sugar-free" Products. This will help patient to have stable blood glucose profile and potentially avoid unintended weight gain.   - I encouraged the patient to switch to unprocessed or minimally processed complex starch and increased protein intake (animal or plant source), fruits, and vegetables.   - Patient is advised to stick to a routine mealtimes to eat 3 meals a day and avoid unnecessary snacks (to snack only to correct hypoglycemia).  - I have approached him with the following individualized plan to manage his diabetes and patient agrees:   -He is advised to lower his premixed insulin (70/30) to 20 units at breakfast and increase supper dose to 30 units if glucose is above 90 and he is eating.   He can continue Metformin 1000 mg po twice daily  and Januvia 100 mg po daily, therapeutically suitable for patient .  -he is encouraged to continue monitoring glucose 4 times daily (using his CGM), before meals and before bed, and to call the clinic if he has readings less than 70 or above 300 for 3 tests in a row.  I encouraged him to restart using his CGM soon and call me if glucose does not level out over the next several days.  - he is warned not to take insulin without proper monitoring per orders. - Adjustment parameters are given to him for hypo and hyperglycemia in writing.  - his Marcelline Deist was previously discontinued, to de-escalate treatment plan and help control cost.  - Specific targets for  A1c; LDL, HDL, and Triglycerides were discussed with the patient.  2) Blood Pressure /Hypertension:  his blood pressure is controlled to target.   he is advised to continue his current medications including Lisinopril 10 mg p.o. daily with breakfast.  3) Lipids/Hyperlipidemia:    Review of his recent lipid panel from 10/26/22 showed controlled LDL at 29 and elevated triglycerides of 323 .  he is advised to continue Crestor 20 mg daily at bedtime.  Side effects and precautions discussed with him.  4)  Weight/Diet:  his Body mass index is 29.79 kg/m.  -    he is NOT a candidate for weight loss.  Exercise, and detailed carbohydrates information  provided  -  detailed on discharge instructions.  5) Chronic Care/Health Maintenance: -he is on ACEI/ARB and Statin medications and is encouraged to initiate and continue to follow up with Ophthalmology, Dentist, Podiatrist at least yearly or according to recommendations, and advised to stay away from smoking. I have recommended yearly flu vaccine and pneumonia vaccine at least every 5 years; moderate intensity exercise for up to 150 minutes weekly; and sleep for at least 7 hours a day.  - he is advised to maintain close follow up with Sheliah Hatch, PA-C for primary care needs, as well as his other  providers for optimal and coordinated care.     I spent  30  minutes in the care of the patient today including review of labs from CMP, Lipids, Thyroid Function, Hematology (current and previous including abstractions from other facilities); face-to-face time discussing  his blood glucose readings/logs, discussing hypoglycemia and hyperglycemia episodes and symptoms, medications doses, his options of short and long term treatment based on the latest standards of care / guidelines;  discussion about incorporating lifestyle medicine;  and documenting the encounter. Risk reduction counseling performed per USPSTF guidelines to reduce obesity and cardiovascular risk factors.     Please refer to Patient Instructions for Blood Glucose Monitoring and Insulin/Medications Dosing Guide"  in media tab for additional information. Please  also refer to " Patient Self Inventory" in the Media  tab for reviewed elements of pertinent patient history.  Jeff Day participated in the discussions, expressed understanding, and voiced agreement with the above plans.  All questions were answered to his satisfaction. he is encouraged to contact clinic should he have any questions or concerns prior to his return visit.     Follow up plan: - Return in about 4 months (around 09/04/2023) for Diabetes F/U with A1c in office, No previsit labs, Bring meter and logs.   Ronny Bacon, [facility] [address] [place], [place] [address] Phone: [phone] Fax: [phone]  05/07/2023, 11:22 AM

## 2023-05-13 ENCOUNTER — Observation Stay (HOSPITAL_COMMUNITY)
Admission: EM | Admit: 2023-05-13 | Discharge: 2023-05-14 | Disposition: A | Discharge status: Home / Self Care | Payer: Medicaid Other | Attending: Internal Medicine | Admitting: Internal Medicine

## 2023-05-13 ENCOUNTER — Emergency Department (HOSPITAL_COMMUNITY): Payer: Medicaid Other

## 2023-05-13 ENCOUNTER — Other Ambulatory Visit: Payer: Self-pay

## 2023-05-13 ENCOUNTER — Encounter (HOSPITAL_COMMUNITY): Payer: Self-pay

## 2023-05-13 DIAGNOSIS — F32A Depression, unspecified: Secondary | ICD-10-CM | POA: Insufficient documentation

## 2023-05-13 DIAGNOSIS — Z79899 Other long term (current) drug therapy: Secondary | ICD-10-CM | POA: Insufficient documentation

## 2023-05-13 DIAGNOSIS — R112 Nausea with vomiting, unspecified: Secondary | ICD-10-CM | POA: Diagnosis present

## 2023-05-13 DIAGNOSIS — Z87891 Personal history of nicotine dependence: Secondary | ICD-10-CM | POA: Insufficient documentation

## 2023-05-13 DIAGNOSIS — F418 Other specified anxiety disorders: Secondary | ICD-10-CM | POA: Diagnosis not present

## 2023-05-13 DIAGNOSIS — E1310 Other specified diabetes mellitus with ketoacidosis without coma: Secondary | ICD-10-CM | POA: Diagnosis not present

## 2023-05-13 DIAGNOSIS — I10 Essential (primary) hypertension: Secondary | ICD-10-CM | POA: Diagnosis not present

## 2023-05-13 DIAGNOSIS — R1110 Vomiting, unspecified: Secondary | ICD-10-CM | POA: Diagnosis present

## 2023-05-13 DIAGNOSIS — R079 Chest pain, unspecified: Secondary | ICD-10-CM | POA: Diagnosis not present

## 2023-05-13 DIAGNOSIS — F419 Anxiety disorder, unspecified: Secondary | ICD-10-CM | POA: Diagnosis not present

## 2023-05-13 DIAGNOSIS — Z794 Long term (current) use of insulin: Secondary | ICD-10-CM | POA: Insufficient documentation

## 2023-05-13 DIAGNOSIS — E1110 Type 2 diabetes mellitus with ketoacidosis without coma: Secondary | ICD-10-CM | POA: Diagnosis present

## 2023-05-13 DIAGNOSIS — E785 Hyperlipidemia, unspecified: Secondary | ICD-10-CM | POA: Insufficient documentation

## 2023-05-13 DIAGNOSIS — E119 Type 2 diabetes mellitus without complications: Secondary | ICD-10-CM

## 2023-05-13 HISTORY — DX: Unspecified osteoarthritis, unspecified site: M19.90

## 2023-05-13 HISTORY — DX: Sleep apnea, unspecified: G47.30

## 2023-05-13 LAB — BASIC METABOLIC PANEL
Anion gap: 19 — ABNORMAL HIGH (ref 5–15)
BUN: 16 mg/dL (ref 6–20)
CO2: 17 mmol/L — ABNORMAL LOW (ref 22–32)
Calcium: 9.5 mg/dL (ref 8.9–10.3)
Chloride: 102 mmol/L (ref 98–111)
Creatinine, Ser: 0.98 mg/dL (ref 0.61–1.24)
GFR, Estimated: >60 mL/min (ref >60)
Glucose, Bld: 350 mg/dL — ABNORMAL HIGH (ref 70–99)
Potassium: 4.9 mmol/L (ref 3.5–5.1)
Sodium: 138 mmol/L (ref 135–145)

## 2023-05-13 LAB — COMPREHENSIVE METABOLIC PANEL
ALT: 20 U/L (ref 0–44)
AST: 20 U/L (ref 15–41)
Albumin: 3.9 g/dL (ref 3.5–5.0)
Alkaline Phosphatase: 55 U/L (ref 38–126)
Anion gap: 20 — ABNORMAL HIGH (ref 5–15)
BUN: 17 mg/dL (ref 6–20)
CO2: 20 mmol/L — ABNORMAL LOW (ref 22–32)
Calcium: 9.6 mg/dL (ref 8.9–10.3)
Chloride: 97 mmol/L — ABNORMAL LOW (ref 98–111)
Creatinine, Ser: 0.95 mg/dL (ref 0.61–1.24)
GFR, Estimated: >60 mL/min (ref >60)
Glucose, Bld: 329 mg/dL — ABNORMAL HIGH (ref 70–99)
Potassium: 4.2 mmol/L (ref 3.5–5.1)
Sodium: 137 mmol/L (ref 135–145)
Total Bilirubin: 1 mg/dL (ref 0.0–1.2)
Total Protein: 7.4 g/dL (ref 6.5–8.1)

## 2023-05-13 LAB — CBC
HCT: 37.8 % — ABNORMAL LOW (ref 39.0–52.0)
Hemoglobin: 12.7 g/dL — ABNORMAL LOW (ref 13.0–17.0)
MCH: 28.8 pg (ref 26.0–34.0)
MCHC: 33.6 g/dL (ref 30.0–36.0)
MCV: 85.7 fL (ref 80.0–100.0)
Platelets: 310 10*3/uL (ref 150–400)
RBC: 4.41 MIL/uL (ref 4.22–5.81)
RDW: 15 % (ref 11.5–15.5)
WBC: 15.9 10*3/uL — ABNORMAL HIGH (ref 4.0–10.5)
nRBC: 0 % (ref 0.0–0.2)

## 2023-05-13 LAB — URINALYSIS, ROUTINE W REFLEX MICROSCOPIC
Bacteria, UA: NONE SEEN
Bilirubin Urine: NEGATIVE
Glucose, UA: >=500 mg/dL — AB
Ketones, ur: 80 mg/dL — AB
Leukocytes,Ua: NEGATIVE
Nitrite: NEGATIVE
Protein, ur: 30 mg/dL — AB
Specific Gravity, Urine: 1.026 (ref 1.005–1.030)
pH: 6 (ref 5.0–8.0)

## 2023-05-13 LAB — BLOOD GAS, VENOUS
Acid-base deficit: 2.8 mmol/L — ABNORMAL HIGH (ref 0.0–2.0)
Bicarbonate: 20.4 mmol/L (ref 20.0–28.0)
Drawn by: 65579
O2 Saturation: 53.3 %
Patient temperature: 36.9
pCO2, Ven: 30 mm[Hg] — ABNORMAL LOW (ref 44–60)
pH, Ven: 7.44 — ABNORMAL HIGH (ref 7.25–7.43)
pO2, Ven: <31 mm[Hg] — CL (ref 32–45)

## 2023-05-13 LAB — TROPONIN I (HIGH SENSITIVITY)
Troponin I (High Sensitivity): 3 ng/L (ref <18)
Troponin I (High Sensitivity): 4 ng/L (ref <18)

## 2023-05-13 LAB — LIPASE, BLOOD: Lipase: 37 U/L (ref 11–51)

## 2023-05-13 LAB — CBG MONITORING, ED
Glucose-Capillary: 254 mg/dL — ABNORMAL HIGH (ref 70–99)
Glucose-Capillary: 260 mg/dL — ABNORMAL HIGH (ref 70–99)
Glucose-Capillary: 295 mg/dL — ABNORMAL HIGH (ref 70–99)
Glucose-Capillary: 295 mg/dL — ABNORMAL HIGH (ref 70–99)

## 2023-05-13 LAB — BETA-HYDROXYBUTYRIC ACID: Beta-Hydroxybutyric Acid: 5.95 mmol/L — ABNORMAL HIGH (ref 0.05–0.27)

## 2023-05-13 MED ORDER — LACTATED RINGERS IV SOLN: INTRAVENOUS | Status: DC

## 2023-05-13 MED ORDER — IOHEXOL 300 MG/ML  SOLN
100.0000 mL | Freq: Once | INTRAMUSCULAR | Status: AC | PRN
  Administered 2023-05-13: 100 mL via INTRAVENOUS

## 2023-05-13 MED ORDER — POTASSIUM CHLORIDE 10 MEQ/100ML IV SOLN
10.0000 meq | INTRAVENOUS | Status: AC
  Administered 2023-05-13 (×2): 10 meq via INTRAVENOUS
  Filled 2023-05-13 (×2): qty 100

## 2023-05-13 MED ORDER — INSULIN REGULAR(HUMAN) IN NACL 100-0.9 UT/100ML-% IV SOLN
INTRAVENOUS | Status: DC
  Administered 2023-05-13: 8 [IU]/h via INTRAVENOUS
  Filled 2023-05-13: qty 100

## 2023-05-13 MED ORDER — MORPHINE SULFATE (PF) 4 MG/ML IV SOLN
4.0000 mg | Freq: Once | INTRAVENOUS | Status: AC
  Administered 2023-05-13: 4 mg via INTRAVENOUS
  Filled 2023-05-13: qty 1

## 2023-05-13 MED ORDER — ONDANSETRON HCL 4 MG/2ML IJ SOLN
4.0000 mg | Freq: Once | INTRAMUSCULAR | Status: AC
Start: 2023-05-13 — End: 2023-05-13
  Administered 2023-05-13: 4 mg via INTRAVENOUS
  Filled 2023-05-13: qty 2

## 2023-05-13 MED ORDER — LACTATED RINGERS IV BOLUS: 20.0000 mL/kg | Freq: Once | INTRAVENOUS | Status: DC

## 2023-05-13 MED ORDER — DEXTROSE IN LACTATED RINGERS 5 % IV SOLN: INTRAVENOUS | Status: DC

## 2023-05-13 MED ORDER — HYDRALAZINE HCL 20 MG/ML IJ SOLN: 10.0000 mg | INTRAMUSCULAR | Status: DC | PRN

## 2023-05-13 MED ORDER — SODIUM CHLORIDE 0.9 % IV BOLUS
1000.0000 mL | Freq: Once | INTRAVENOUS | Status: AC
  Administered 2023-05-13: 1000 mL via INTRAVENOUS

## 2023-05-13 MED ORDER — DEXTROSE 50 % IV SOLN: 0.0000 mL | INTRAVENOUS | Status: DC | PRN

## 2023-05-13 NOTE — ED Triage Notes (Signed)
Pt arrives [facility] from home with reports of Cp starting Today around 1 hr PTA. NV since last night with diaphoresis NSR EKG with EMS 190/70 BP, 332 CBG, attempted Asprin 324 but vomited it up. 4 Mg Zofran IV, 18 G Left AC, fluids KVO with EMS.

## 2023-05-13 NOTE — H&P (Signed)
History and Physical    Jeff Day BMW:[id number] DOB: 26-Nov-1965 DOA: 05/13/2023  PCP: Sheliah Hatch, PA-C   Patient coming from: Home  I have personally briefly reviewed patient's old medical records in [facility]  Chief Complaint: Vomiting,  Abdominal pain  HPI: Jeff Day is a 58 y.o. male with medical history significant for hypertension, diabetes mellitus, depression and anxiety. Patient presented to the ED with complaints of sudden onset of nausea and multiple episodes of vomiting that started about 1 AM early this morning, he also reports multiple watery stools.  Reports abdominal pain also, but this is consistent with his chronic intermittent abdominal pain.  Also reports onset of mid lower chest pain started after vomiting, and has persisted since onset.  Reports some pain in his right arm.  Prior chest pain before today.  No cardiac history.  Reports has had a cardiac cath in the past which was unremarkable. He is on insulin, and reports compliance except for this morning, as he was vomiting.  ED Course: Temperature 98.5.  Heart rate 60s.  Respirate rate 17-27.  Blood pressure systolic 140s to 272Z.  Sats greater than 96% on room air.  WBC 15.9.  Anion gap 20, serum bicarb 20, blood glucose 229.  UA not suggestive of UTI.  Chest x-ray clear.  VBG shows pH of 7.4. Troponin 3 > 4.  BHB-pending CT abdomen pelvis with contrast negative for acute abnormality. ~4L bolus given. DKA protocol started with insulin drip started.  Hospitalist admit for early DKA and intractable vomiting.  Review of Systems: As per HPI all other systems reviewed and negative.  Past Medical History:  Diagnosis Date   Back pain    Depression with anxiety    Diabetes (HCC)    GERD (gastroesophageal reflux disease)    Headache    Hyperlipemia    Hypertension    Wears dentures     Past Surgical History:  Procedure Laterality Date   CARDIAC CATHETERIZATION     Normal   CARPAL  TUNNEL RELEASE     COLONOSCOPY     HAND SURGERY Right    2006     reports that he has quit smoking. He has quit using smokeless tobacco. He reports current alcohol use. He reports current drug use. Drug: Marijuana.  No Known Allergies  Family History  Problem Relation Age of Onset   Obesity Mother    Other Mother        meningitis   Cirrhosis Father    Alcoholism Father    Diabetes Sister    Diabetes Paternal Grandfather    Diabetes Maternal Grandfather    Suicidality Brother    Prior to Admission medications   Medication Sig Start Date End Date Taking? Authorizing Provider  buPROPion (WELLBUTRIN XL) 150 MG 24 hr tablet Take 150 mg by mouth daily.   Yes [provider]  escitalopram (LEXAPRO) 20 MG tablet Take 20 mg by mouth daily.   Yes [provider]  gabapentin (NEURONTIN) 400 MG capsule Take 1 capsule by mouth 3 (three) times daily.   Yes [provider]  HUMULIN 70/30 KWIKPEN (70-30) 100 UNIT/ML KwikPen Inject 30 Units into the skin 2 (two) times daily. 05/07/23  Yes Dani Gobble, NP  HYDROcodone-acetaminophen (NORCO/VICODIN) 5-325 MG tablet Take 1 tablet by mouth every 6 (six) hours as needed for moderate pain (pain score 4-6). 05/06/23  Yes [provider]  JANUVIA 100 MG tablet Take 1 tablet (100 mg  total) by mouth daily. 01/12/23  Yes Reardon, Freddi Starr, NP  levocetirizine (XYZAL) 5 MG tablet Take 5 mg by mouth every evening.   Yes [provider]  lisinopril (ZESTRIL) 10 MG tablet Take 10 mg by mouth daily. 09/26/22  Yes [provider]  meloxicam (MOBIC) 7.5 MG tablet Take 1 tablet by mouth daily. 12/14/22  Yes [provider]  metFORMIN (GLUCOPHAGE) 1000 MG tablet Take 1 tablet (1,000 mg total) by mouth 2 (two) times daily with a meal. 01/12/23  Yes Reardon, Whitney J, NP  montelukast (SINGULAIR) 10 MG tablet Take 10 mg by mouth daily.   Yes [provider]  pantoprazole (PROTONIX) 40 MG tablet Take  1 tablet (40 mg total) by mouth 2 (two) times daily. 11/10/22  Yes Vassie Loll, MD  rosuvastatin (CRESTOR) 20 MG tablet Take 20 mg by mouth daily.   Yes [provider]  valACYclovir (VALTREX) 500 MG tablet Take 500 mg by mouth 2 (two) times daily.   Yes [provider]  ACCU-CHEK AVIVA PLUS test strip USE AS DIRECTED 10/20/11   Dunn, Raymon Mutton, PA-C  Insulin Pen Needle (CAREFINE PEN NEEDLES) 32G X 6 MM MISC Use to inject insulin twice daily 01/12/23   Dani Gobble, NP    Physical Exam: Vitals:   05/13/23 1549 05/13/23 1551 05/13/23 1730  BP:  (!) 145/74 (!) 184/72  Pulse:  60 65  Resp:  (!) 27 17  Temp:  98.5 F (36.9 C)   TempSrc:  Oral   SpO2:  100% 96%  Weight: 95.7 kg    Height: 5\' 11"  (1.803 m)      Constitutional: NAD, calm, comfortable Vitals:   05/13/23 1549 05/13/23 1551 05/13/23 1730  BP:  (!) 145/74 (!) 184/72  Pulse:  60 65  Resp:  (!) 27 17  Temp:  98.5 F (36.9 C)   TempSrc:  Oral   SpO2:  100% 96%  Weight: 95.7 kg    Height: 5\' 11"  (1.803 m)     Eyes: PERRL, lids and conjunctivae normal ENMT: Mucous membranes are moist.  Neck: normal, supple, no masses, no thyromegaly Respiratory: clear to auscultation bilaterally, no wheezing, no crackles. Normal respiratory effort. No accessory muscle use.  Cardiovascular: Regular rate and rhythm, no murmurs / rubs / gallops. No extremity edema.  Extremities warm.   Abdomen: no tenderness, no masses palpated. No hepatosplenomegaly. Bowel sounds positive.  Musculoskeletal: no clubbing / cyanosis. No joint deformity upper and lower extremities. Skin: no rashes, lesions, ulcers. No induration Neurologic: No facial symmetry, moving extremities spontaneously, speech fluent.  Psychiatric: Normal judgment and insight. Alert and oriented x 3. Normal mood.   Labs on Admission: I have personally reviewed following labs and imaging studies  CBC: Recent Labs  Lab 05/13/23 1600  WBC 15.9*  HGB 12.7*  HCT  37.8*  MCV 85.7  PLT 310   Basic Metabolic Panel: Recent Labs  Lab 05/13/23 1600 05/13/23 1957  NA 137 138  K 4.2 4.9  CL 97* 102  CO2 20* 17*  GLUCOSE 329* 350*  BUN 17 16  CREATININE 0.95 0.98  CALCIUM 9.6 9.5   GFR: Estimated Creatinine Clearance: 95.9 mL/min (by C-G formula based on SCr of 0.98 mg/dL). Liver Function Tests: Recent Labs  Lab 05/13/23 1600  AST 20  ALT 20  ALKPHOS 55  BILITOT 1.0  PROT 7.4  ALBUMIN 3.9   Recent Labs  Lab 05/13/23 1600  LIPASE 37   Urine analysis:  Component Value Date/Time   COLORURINE STRAW (A) 05/13/2023 1745   APPEARANCEUR CLEAR 05/13/2023 1745   LABSPEC 1.026 05/13/2023 1745   PHURINE 6.0 05/13/2023 1745   GLUCOSEU >=500 (A) 05/13/2023 1745   HGBUR SMALL (A) 05/13/2023 1745   BILIRUBINUR NEGATIVE 05/13/2023 1745   BILIRUBINUR neg 08/11/2014 0854   KETONESUR 80 (A) 05/13/2023 1745   PROTEINUR 30 (A) 05/13/2023 1745   UROBILINOGEN 0.2 08/11/2014 0854   NITRITE NEGATIVE 05/13/2023 1745   LEUKOCYTESUR NEGATIVE 05/13/2023 1745    Radiological Exams on Admission: CT ABDOMEN PELVIS W CONTRAST Result Date: 05/13/2023 CLINICAL DATA:  General abdominal pain, nausea, vomiting EXAM: CT ABDOMEN AND PELVIS WITH CONTRAST TECHNIQUE: Multidetector CT imaging of the abdomen and pelvis was performed using the standard protocol following bolus administration of intravenous contrast. RADIATION DOSE REDUCTION: This exam was performed according to the departmental dose-optimization program which includes automated exposure control, adjustment of the mA and/or kV according to patient size and/or use of iterative reconstruction technique. CONTRAST:  OMNIPAQUE IOHEXOL 300 MG/ML  SOLN COMPARISON:  11/07/2022 FINDINGS: Lower chest: No acute abnormality Hepatobiliary: No focal hepatic abnormality. Gallbladder unremarkable. Pancreas: No focal abnormality or ductal dilatation. Spleen: No focal abnormality.  Normal size. Adrenals/Urinary Tract:  No adrenal abnormality. No focal renal abnormality. No stones or hydronephrosis. Urinary bladder is unremarkable. Stomach/Bowel: Normal appendix Stomach, large and small bowel grossly unremarkable. Vascular/Lymphatic: Aortic atherosclerosis. No evidence of aneurysm or adenopathy. Reproductive: No visible focal abnormality. Other: No free fluid or free air. Musculoskeletal: No acute bony abnormality. IMPRESSION: No acute findings in the abdomen or pelvis. Aortic atherosclerosis. Electronically Signed   By: Charlett Nose M.D.   On: 05/13/2023 18:32   DG Chest Port 1 View Result Date: 05/13/2023 CLINICAL DATA:  Chest pain today. Nausea and vomiting since last night. Ex-smoker. The patient currently vapes. EXAM: PORTABLE CHEST 1 VIEW COMPARISON:  11/07/2022 FINDINGS: The cardiac silhouette is near the upper limit of normal in size. Clear lungs with normal vascularity. Stable mildly prominent interstitial markings without Kerley lines or pleural fluid. Unremarkable bones. IMPRESSION: 1. No acute abnormality. 2. Stable mild chronic interstitial lung disease. Electronically Signed   By: Beckie Salts M.D.   On: 05/13/2023 16:12    EKG: Independently reviewed.  Sinus rhythm, rate 65, QTc 476.  No significant change from prior.  Assessment/Plan Principal Problem:   Intractable vomiting Active Problems:   DKA (diabetic ketoacidosis) (HCC)   Essential hypertension   DMII (diabetes mellitus, type 2) (HCC)   Depression with anxiety   Assessment and Plan: * Intractable vomiting Reports nausea and multiple episodes of vomiting, also reports diarrhea.  Possible gastritoenteritis versus DKA as etiology.  Afebrile, with leukocytosis of 15.9.  CT abdomen and pelvis without acute abnormality.  UA not suggestive of UTI. -Leukocytosis possibly stress reaction, trend  -Hydrate -As needed Zofran - With leukocytosis, will check stool C. difficile  DKA (diabetic ketoacidosis) (HCC) Likely early DKA, with serum bicarb  20, anion gap of 20, serum glucose of 329.  ABG with pH of 7.44.  Uncontrolled diabetes, A1c 8.4.  He is on insulin 70/30-30 units a.m. and 20 units p.m.  Also on metformin and Januvia. -BHB pending -Continue insulin drip started in ED. -Hold home medications Januvia, metformin. - ~4 L bolus given, continue LR 100cc/hr and transition to D5 fluids per protocol -BMP Q4 hourly -Hold home insulins, metformin and Januvia.  Depression with anxiety Resume Lexapro, Wellbutrin  Essential hypertension Elevated. -On low-dose lisinopril 10mg , held for  contrast exposure -As needed hydralazine 10 mg for systolic greater than 170   Chest pain-in the setting of intractable vomiting.  EKG troponins unremarkable.  No cardiac history. -Monitor for now  DVT prophylaxis: Lovenox Code Status: FULL Code Family Communication: None at bedside Disposition Plan: ~ 2 days Consults called: None Admission status: Inpt Stepdown I certify that at the point of admission it is my clinical judgment that the patient will require inpatient hospital care spanning beyond 2 midnights from the point of admission due to high intensity of service, high risk for further deterioration and high frequency of surveillance required.    Author: Onnie Boer, MD 05/13/2023 10:05 PM  For on call review [email or web address].

## 2023-05-13 NOTE — Assessment & Plan Note (Addendum)
Likely early DKA, with serum bicarb 20, anion gap of 20, serum glucose of 329.  ABG with pH of 7.44.  Uncontrolled diabetes, A1c 8.4.  He is on insulin 70/30-30 units a.m. and 20 units p.m.  Also on metformin and Januvia. -BHB pending -Continue insulin drip started in ED. -Hold home medications Januvia, metformin. - ~4 L bolus given, continue LR 100cc/hr and transition to D5 fluids per protocol -BMP Q4 hourly -Hold home insulins, metformin and Januvia.

## 2023-05-13 NOTE — ED Provider Notes (Signed)
[place] EMERGENCY DEPARTMENT AT [facility] Provider Note   CSN: [id number] Arrival date & time: 05/13/23  1544     History  Chief Complaint  Patient presents with   Chest Pain    Jeff Day is a 58 y.o. male.  58 year old male with past medical history of diabetes and hypertension presenting to the emergency department today with chest pain and abdominal pain.  The patient states that he has been having abdominal pain ongoing on and off now for the past few weeks to months.  He states that this morning he started develop pain in the center of his chest.  Reports it does not radiate.  He has had multiple episodes of nausea and vomiting since 1 AM this morning.  Is also reporting some loose stools over the past few days.  He is ported mild cough with this.  He came to the ER today for further evaluation due to this.  He denies a history of DVT or pulmonary embolism, recent surgeries, recent travel.  He states that he was then in the hospital recently and had some imaging performed that did show that his spleen was enlarged.  He denies any leg pain or swelling.  Denies any focal weakness, numbness, or tingling.   Chest Pain Associated symptoms: abdominal pain, nausea and vomiting        Home Medications Prior to Admission medications   Medication Sig Start Date End Date Taking? Authorizing Provider  buPROPion (WELLBUTRIN XL) 150 MG 24 hr tablet Take 150 mg by mouth daily.   Yes [provider]  escitalopram (LEXAPRO) 20 MG tablet Take 20 mg by mouth daily.   Yes [provider]  gabapentin (NEURONTIN) 400 MG capsule Take 1 capsule by mouth 3 (three) times daily.   Yes [provider]  HUMULIN 70/30 KWIKPEN (70-30) 100 UNIT/ML KwikPen Inject 30 Units into the skin 2 (two) times daily. 05/07/23  Yes Dani Gobble, NP  HYDROcodone-acetaminophen (NORCO/VICODIN) 5-325 MG tablet Take 1 tablet by mouth every 6 (six) hours as needed for moderate  pain (pain score 4-6). 05/06/23  Yes [provider]  JANUVIA 100 MG tablet Take 1 tablet (100 mg total) by mouth daily. 01/12/23  Yes Reardon, Freddi Starr, NP  levocetirizine (XYZAL) 5 MG tablet Take 5 mg by mouth every evening.   Yes [provider]  lisinopril (ZESTRIL) 10 MG tablet Take 10 mg by mouth daily. 09/26/22  Yes [provider]  meloxicam (MOBIC) 7.5 MG tablet Take 1 tablet by mouth daily. 12/14/22  Yes [provider]  metFORMIN (GLUCOPHAGE) 1000 MG tablet Take 1 tablet (1,000 mg total) by mouth 2 (two) times daily with a meal. 01/12/23  Yes Reardon, Whitney J, NP  montelukast (SINGULAIR) 10 MG tablet Take 10 mg by mouth daily.   Yes [provider]  pantoprazole (PROTONIX) 40 MG tablet Take 1 tablet (40 mg total) by mouth 2 (two) times daily. 11/10/22  Yes Vassie Loll, MD  rosuvastatin (CRESTOR) 20 MG tablet Take 20 mg by mouth daily.   Yes [provider]  valACYclovir (VALTREX) 500 MG tablet Take 500 mg by mouth 2 (two) times daily.   Yes [provider]  ACCU-CHEK AVIVA PLUS test strip USE AS DIRECTED 10/20/11   Dunn, Raymon Mutton, PA-C  Insulin Pen Needle (CAREFINE PEN NEEDLES) 32G X 6 MM MISC Use to inject insulin twice daily 01/12/23   Dani Gobble, NP      Allergies  Patient has no known allergies.    Review of Systems   Review of Systems  Cardiovascular:  Positive for chest pain.  Gastrointestinal:  Positive for abdominal pain, diarrhea, nausea and vomiting.  All other systems reviewed and are negative.   Physical Exam Updated Vital Signs BP (!) 125/59   Pulse 72   Temp 98.5 F (36.9 C) (Oral)   Resp 15   Ht 5\' 11"  (1.803 m)   Wt 95.7 kg   SpO2 97%   BMI 29.43 kg/m  Physical Exam Vitals and nursing note reviewed.   Gen: Appears uncomfortable Eyes: PERRL, EOMI HEENT: no oropharyngeal swelling Neck: trachea midline Resp: clear to auscultation bilaterally Card: RRR, no murmurs, rubs, or  gallops Abd: The patient is tender in the epigastrium right upper quadrant without guarding rebound Extremities: no calf tenderness, no edema Vascular: 2+ radial pulses bilaterally, 2+ DP pulses bilaterally Skin: no rashes Psyc: acting appropriately   ED Results / Procedures / Treatments   Labs (all labs ordered are listed, but only abnormal results are displayed) Labs Reviewed  CBC - Abnormal; Notable for the following components:      Result Value   WBC 15.9 (*)    Hemoglobin 12.7 (*)    HCT 37.8 (*)    All other components within normal limits  COMPREHENSIVE METABOLIC PANEL - Abnormal; Notable for the following components:   Chloride 97 (*)    CO2 20 (*)    Glucose, Bld 329 (*)    Anion gap 20 (*)    All other components within normal limits  BLOOD GAS, VENOUS - Abnormal; Notable for the following components:   pH, Ven 7.44 (*)    pCO2, Ven 30 (*)    pO2, Ven <31 (*)    Acid-base deficit 2.8 (*)    All other components within normal limits  URINALYSIS, ROUTINE W REFLEX MICROSCOPIC - Abnormal; Notable for the following components:   Color, Urine STRAW (*)    Glucose, UA >=500 (*)    Hgb urine dipstick SMALL (*)    Ketones, ur 80 (*)    Protein, ur 30 (*)    All other components within normal limits  BASIC METABOLIC PANEL - Abnormal; Notable for the following components:   CO2 17 (*)    Glucose, Bld 350 (*)    Anion gap 19 (*)    All other components within normal limits  BETA-HYDROXYBUTYRIC ACID - Abnormal; Notable for the following components:   Beta-Hydroxybutyric Acid 5.95 (*)    All other components within normal limits  CBG MONITORING, ED - Abnormal; Notable for the following components:   Glucose-Capillary 295 (*)    All other components within normal limits  CBG MONITORING, ED - Abnormal; Notable for the following components:   Glucose-Capillary 295 (*)    All other components within normal limits  CBG MONITORING, ED - Abnormal; Notable for the following  components:   Glucose-Capillary 254 (*)    All other components within normal limits  C DIFFICILE QUICK SCREEN W PCR REFLEX    LIPASE, BLOOD  BETA-HYDROXYBUTYRIC ACID  BASIC METABOLIC PANEL  BASIC METABOLIC PANEL  BASIC METABOLIC PANEL  TROPONIN I (HIGH SENSITIVITY)  TROPONIN I (HIGH SENSITIVITY)    EKG EKG Interpretation Date/Time:  Thursday May 13 2023 16:19:13 EST Ventricular Rate:  65 PR Interval:  106 QRS Duration:  106 QT Interval:  457 QTC Calculation: 476 R Axis:   54  Text Interpretation: Sinus rhythm Short PR interval Borderline  prolonged QT interval Confirmed by Beckey Downing [id number]) on 05/13/2023 4:21:54 PM  Radiology CT ABDOMEN PELVIS W CONTRAST Result Date: 05/13/2023 CLINICAL DATA:  General abdominal pain, nausea, vomiting EXAM: CT ABDOMEN AND PELVIS WITH CONTRAST TECHNIQUE: Multidetector CT imaging of the abdomen and pelvis was performed using the standard protocol following bolus administration of intravenous contrast. RADIATION DOSE REDUCTION: This exam was performed according to the departmental dose-optimization program which includes automated exposure control, adjustment of the mA and/or kV according to patient size and/or use of iterative reconstruction technique. CONTRAST:  OMNIPAQUE IOHEXOL 300 MG/ML  SOLN COMPARISON:  11/07/2022 FINDINGS: Lower chest: No acute abnormality Hepatobiliary: No focal hepatic abnormality. Gallbladder unremarkable. Pancreas: No focal abnormality or ductal dilatation. Spleen: No focal abnormality.  Normal size. Adrenals/Urinary Tract: No adrenal abnormality. No focal renal abnormality. No stones or hydronephrosis. Urinary bladder is unremarkable. Stomach/Bowel: Normal appendix Stomach, large and small bowel grossly unremarkable. Vascular/Lymphatic: Aortic atherosclerosis. No evidence of aneurysm or adenopathy. Reproductive: No visible focal abnormality. Other: No free fluid or free air. Musculoskeletal: No acute bony abnormality.  IMPRESSION: No acute findings in the abdomen or pelvis. Aortic atherosclerosis. Electronically Signed   By: Charlett Nose M.D.   On: 05/13/2023 18:32   DG Chest Port 1 View Result Date: 05/13/2023 CLINICAL DATA:  Chest pain today. Nausea and vomiting since last night. Ex-smoker. The patient currently vapes. EXAM: PORTABLE CHEST 1 VIEW COMPARISON:  11/07/2022 FINDINGS: The cardiac silhouette is near the upper limit of normal in size. Clear lungs with normal vascularity. Stable mildly prominent interstitial markings without Kerley lines or pleural fluid. Unremarkable bones. IMPRESSION: 1. No acute abnormality. 2. Stable mild chronic interstitial lung disease. Electronically Signed   By: Beckie Salts M.D.   On: 05/13/2023 16:12    Procedures Procedures    Medications Ordered in ED Medications  lactated ringers bolus 1,914 mL (has no administration in time range)  insulin regular, human (MYXREDLIN) 100 units/ 100 mL infusion (8 Units/hr Intravenous New Bag/Given 05/13/23 2148)  lactated ringers infusion ( Intravenous New Bag/Given 05/13/23 2149)  dextrose 5 % in lactated ringers infusion (has no administration in time range)  dextrose 50 % solution 0-50 mL (has no administration in time range)  potassium chloride 10 mEq in 100 mL IVPB (10 mEq Intravenous New Bag/Given 05/13/23 2339)  hydrALAZINE (APRESOLINE) injection 10 mg (has no administration in time range)  morphine (PF) 4 MG/ML injection 4 mg (4 mg Intravenous Given 05/13/23 1611)  ondansetron (ZOFRAN) injection 4 mg (4 mg Intravenous Given 05/13/23 1611)  iohexol (OMNIPAQUE) 300 MG/ML solution 100 mL (100 mLs Intravenous Contrast Given 05/13/23 1820)  sodium chloride 0.9 % bolus 1,000 mL (0 mLs Intravenous Stopped 05/13/23 2150)  sodium chloride 0.9 % bolus 1,000 mL (1,000 mLs Intravenous New Bag/Given 05/13/23 2042)    ED Course/ Medical Decision Making/ A&P                                 Medical Decision Making 58 year old male with past  medical history of diabetes and hypertension presenting to the emergency department today with chest pain and abdominal pain.  These do not seem to be temporally related.  I will further evaluate patient here with basic labs Wels and EKG, chest x-ray and troponin for further evaluation for ACS, pulmonary edema, pulmonary infiltrates, or pneumothorax. Description of his symptoms and reassuring vital signs suspicion for aortic dissection or pulmonary embolism is low  at this time.  I will obtain LFTs and a lipase to evaluate for papular pathology or pancreatitis.  Will obtain a CT scan of his abdomen to further evaluate for appendicitis, diverticulitis, colitis, obstruction, or other intra-abdominal pathology.  I will give patient morphine Zofran for his symptoms.  The patient did have a recent ultrasound which did not show any gallstones.  I will reevaluate for ultimate disposition.  The patient's work Is relatively reassuring overall.  Does have an anion gap and elevated blood glucose with ketones in his urine.  He is given 2 L of fluid.  I will recheck his BMP to see if his gap closes and to reevaluate his blood sugar after fluid hydration to determine whether he will need to be placed on an insulin infusion.  His CT scan of his abdomen does not show any acute findings.  I will reevaluate for ultimate disposition.  The patient continued to have an anion gap and his bicarb is downtrending.  Patient started on insulin infusion for DKA.  He is admitted to hospital service for further evaluation management.  Amount and/or Complexity of Data Reviewed Labs: ordered. Radiology: ordered.  Risk Prescription drug management. Decision regarding hospitalization.   CRITICAL CARE Performed by: Durwin Glaze   Total critical care time: 40 minutes  Critical care time was exclusive of separately billable procedures and treating other patients.  Critical care was necessary to treat or prevent imminent or  life-threatening deterioration.  Critical care was time spent personally by me on the following activities: development of treatment plan with patient and/or surrogate as well as nursing, discussions with consultants, evaluation of patient's response to treatment, examination of patient, obtaining history from patient or surrogate, ordering and performing treatments and interventions, ordering and review of laboratory studies, ordering and review of radiographic studies, pulse oximetry and re-evaluation of patient's condition.         Final Clinical Impression(s) / ED Diagnoses Final diagnoses:  Diabetic ketoacidosis without coma associated with other specified diabetes mellitus [facility])    Rx / DC Orders ED Discharge Orders     None         Durwin Glaze, MD 05/13/23 2342

## 2023-05-13 NOTE — Assessment & Plan Note (Addendum)
Reports nausea and multiple episodes of vomiting, also reports diarrhea.  Possible gastritoenteritis versus DKA as etiology.  Afebrile, with leukocytosis of 15.9.  CT abdomen and pelvis without acute abnormality.  UA not suggestive of UTI. -Leukocytosis possibly stress reaction, trend  -Hydrate -As needed Zofran - With leukocytosis, will check stool C. difficile

## 2023-05-13 NOTE — Assessment & Plan Note (Addendum)
Elevated. -On low-dose lisinopril 10mg , held for contrast exposure -As needed hydralazine 10 mg for systolic greater than 170

## 2023-05-13 NOTE — Assessment & Plan Note (Signed)
Resume Lexapro, Wellbutrin

## 2023-05-14 ENCOUNTER — Encounter (HOSPITAL_COMMUNITY): Payer: Self-pay | Admitting: Internal Medicine

## 2023-05-14 DIAGNOSIS — R1110 Vomiting, unspecified: Secondary | ICD-10-CM | POA: Diagnosis not present

## 2023-05-14 DIAGNOSIS — R112 Nausea with vomiting, unspecified: Secondary | ICD-10-CM | POA: Diagnosis present

## 2023-05-14 LAB — GLUCOSE, CAPILLARY
Glucose-Capillary: 208 mg/dL — ABNORMAL HIGH (ref 70–99)
Glucose-Capillary: 217 mg/dL — ABNORMAL HIGH (ref 70–99)
Glucose-Capillary: 204 mg/dL — ABNORMAL HIGH (ref 70–99)
Glucose-Capillary: 181 mg/dL — ABNORMAL HIGH (ref 70–99)
Glucose-Capillary: 187 mg/dL — ABNORMAL HIGH (ref 70–99)
Glucose-Capillary: 156 mg/dL — ABNORMAL HIGH (ref 70–99)
Glucose-Capillary: 179 mg/dL — ABNORMAL HIGH (ref 70–99)
Glucose-Capillary: 159 mg/dL — ABNORMAL HIGH (ref 70–99)
Glucose-Capillary: 161 mg/dL — ABNORMAL HIGH (ref 70–99)

## 2023-05-14 LAB — BASIC METABOLIC PANEL
Anion gap: 11 (ref 5–15)
Anion gap: 6 (ref 5–15)
Anion gap: 11 (ref 5–15)
BUN: 16 mg/dL (ref 6–20)
BUN: 16 mg/dL (ref 6–20)
BUN: 15 mg/dL (ref 6–20)
CO2: 23 mmol/L (ref 22–32)
CO2: 24 mmol/L (ref 22–32)
CO2: 22 mmol/L (ref 22–32)
Calcium: 8.6 mg/dL — ABNORMAL LOW (ref 8.9–10.3)
Calcium: 8.5 mg/dL — ABNORMAL LOW (ref 8.9–10.3)
Calcium: 8.7 mg/dL — ABNORMAL LOW (ref 8.9–10.3)
Chloride: 102 mmol/L (ref 98–111)
Chloride: 104 mmol/L (ref 98–111)
Chloride: 104 mmol/L (ref 98–111)
Creatinine, Ser: 0.96 mg/dL (ref 0.61–1.24)
Creatinine, Ser: 0.82 mg/dL (ref 0.61–1.24)
Creatinine, Ser: 0.85 mg/dL (ref 0.61–1.24)
GFR, Estimated: >60 mL/min (ref >60)
GFR, Estimated: >60 mL/min (ref >60)
GFR, Estimated: >60 mL/min (ref >60)
Glucose, Bld: 222 mg/dL — ABNORMAL HIGH (ref 70–99)
Glucose, Bld: 201 mg/dL — ABNORMAL HIGH (ref 70–99)
Glucose, Bld: 174 mg/dL — ABNORMAL HIGH (ref 70–99)
Potassium: 3.9 mmol/L (ref 3.5–5.1)
Potassium: 3.5 mmol/L (ref 3.5–5.1)
Potassium: 3.7 mmol/L (ref 3.5–5.1)
Sodium: 136 mmol/L (ref 135–145)
Sodium: 134 mmol/L — ABNORMAL LOW (ref 135–145)
Sodium: 137 mmol/L (ref 135–145)

## 2023-05-14 LAB — BETA-HYDROXYBUTYRIC ACID
Beta-Hydroxybutyric Acid: 0.13 mmol/L (ref 0.05–0.27)
Beta-Hydroxybutyric Acid: 1.31 mmol/L — ABNORMAL HIGH (ref 0.05–0.27)

## 2023-05-14 LAB — CBC
HCT: 32 % — ABNORMAL LOW (ref 39.0–52.0)
Hemoglobin: 10.4 g/dL — ABNORMAL LOW (ref 13.0–17.0)
MCH: 27.9 pg (ref 26.0–34.0)
MCHC: 32.5 g/dL (ref 30.0–36.0)
MCV: 85.8 fL (ref 80.0–100.0)
Platelets: 255 10*3/uL (ref 150–400)
RBC: 3.73 MIL/uL — ABNORMAL LOW (ref 4.22–5.81)
RDW: 15.2 % (ref 11.5–15.5)
WBC: 10.5 10*3/uL (ref 4.0–10.5)
nRBC: 0 % (ref 0.0–0.2)

## 2023-05-14 LAB — MRSA NEXT GEN BY PCR, NASAL: MRSA by PCR Next Gen: NOT DETECTED

## 2023-05-14 LAB — CBG MONITORING, ED
Glucose-Capillary: 182 mg/dL — ABNORMAL HIGH (ref 70–99)
Glucose-Capillary: 180 mg/dL — ABNORMAL HIGH (ref 70–99)

## 2023-05-14 MED ORDER — PANTOPRAZOLE SODIUM 40 MG PO TBEC
40.0000 mg | DELAYED_RELEASE_TABLET | Freq: Two times a day (BID) | ORAL | Status: DC
Start: 2023-05-14 — End: 2023-05-14
  Administered 2023-05-14: 40 mg via ORAL
  Filled 2023-05-14: qty 1

## 2023-05-14 MED ORDER — BLOOD GLUCOSE MONITORING SUPPL DEVI: 1.0000 | Freq: Three times a day (TID) | 0 refills | Status: AC

## 2023-05-14 MED ORDER — LEVOCETIRIZINE DIHYDROCHLORIDE 5 MG PO TABS: 5.0000 mg | ORAL_TABLET | Freq: Every evening | ORAL | Status: DC

## 2023-05-14 MED ORDER — ESCITALOPRAM OXALATE 10 MG PO TABS
20.0000 mg | ORAL_TABLET | Freq: Every day | ORAL | Status: DC
  Administered 2023-05-14: 20 mg via ORAL
  Filled 2023-05-14: qty 2

## 2023-05-14 MED ORDER — BUPROPION HCL ER (XL) 150 MG PO TB24
150.0000 mg | ORAL_TABLET | Freq: Every day | ORAL | Status: DC
  Administered 2023-05-14: 150 mg via ORAL
  Filled 2023-05-14: qty 1

## 2023-05-14 MED ORDER — INFLUENZA VIRUS VACC SPLIT PF (FLUZONE) 0.5 ML IM SUSY: 0.5000 mL | PREFILLED_SYRINGE | INTRAMUSCULAR | Status: DC

## 2023-05-14 MED ORDER — ACETAMINOPHEN 325 MG PO TABS: 650.0000 mg | ORAL_TABLET | Freq: Four times a day (QID) | ORAL | Status: DC | PRN

## 2023-05-14 MED ORDER — ROSUVASTATIN CALCIUM 20 MG PO TABS
20.0000 mg | ORAL_TABLET | Freq: Every day | ORAL | Status: DC
  Administered 2023-05-14: 20 mg via ORAL
  Filled 2023-05-14: qty 1

## 2023-05-14 MED ORDER — INSULIN GLARGINE 100 UNIT/ML SOLOSTAR PEN: 30.0000 [IU] | PEN_INJECTOR | Freq: Every day | SUBCUTANEOUS | 1 refills | Status: AC

## 2023-05-14 MED ORDER — ONDANSETRON HCL 4 MG PO TABS: 4.0000 mg | ORAL_TABLET | Freq: Four times a day (QID) | ORAL | Status: DC | PRN

## 2023-05-14 MED ORDER — INSULIN ASPART 100 UNIT/ML IJ SOLN
4.0000 [IU] | Freq: Three times a day (TID) | INTRAMUSCULAR | Status: DC
  Administered 2023-05-14: 4 [IU] via SUBCUTANEOUS

## 2023-05-14 MED ORDER — ENOXAPARIN SODIUM 40 MG/0.4ML IJ SOSY
40.0000 mg | PREFILLED_SYRINGE | INTRAMUSCULAR | Status: DC
  Administered 2023-05-14: 40 mg via SUBCUTANEOUS
  Filled 2023-05-14: qty 0.4

## 2023-05-14 MED ORDER — BLOOD GLUCOSE TEST VI STRP: 1.0000 | ORAL_STRIP | Freq: Three times a day (TID) | 0 refills | Status: DC

## 2023-05-14 MED ORDER — LACTATED RINGERS IV SOLN: INTRAVENOUS | Status: DC

## 2023-05-14 MED ORDER — PEN NEEDLES 31G X 5 MM MISC: 1.0000 | Freq: Three times a day (TID) | 0 refills | Status: AC

## 2023-05-14 MED ORDER — LORATADINE 10 MG PO TABS: 10.0000 mg | ORAL_TABLET | Freq: Every day | ORAL | Status: DC

## 2023-05-14 MED ORDER — ONDANSETRON HCL 4 MG/2ML IJ SOLN: 4.0000 mg | Freq: Four times a day (QID) | INTRAMUSCULAR | Status: DC | PRN

## 2023-05-14 MED ORDER — INSULIN GLARGINE-YFGN 100 UNIT/ML ~~LOC~~ SOLN
20.0000 [IU] | Freq: Once | SUBCUTANEOUS | Status: AC
  Administered 2023-05-14: 20 [IU] via SUBCUTANEOUS
  Filled 2023-05-14: qty 0.2

## 2023-05-14 MED ORDER — LANCET DEVICE MISC: 1.0000 | Freq: Three times a day (TID) | 0 refills | Status: DC

## 2023-05-14 MED ORDER — INSULIN ASPART 100 UNIT/ML IJ SOLN
0.0000 [IU] | INTRAMUSCULAR | Status: DC
Start: 2023-05-14 — End: 2023-05-14
  Administered 2023-05-14: 2 [IU] via SUBCUTANEOUS

## 2023-05-14 MED ORDER — INSULIN ASPART 100 UNIT/ML FLEXPEN: 5.0000 [IU] | PEN_INJECTOR | Freq: Three times a day (TID) | SUBCUTANEOUS | 1 refills | Status: AC

## 2023-05-14 MED ORDER — PNEUMOCOCCAL 20-VAL CONJ VACC 0.5 ML IM SUSY: 0.5000 mL | PREFILLED_SYRINGE | INTRAMUSCULAR | Status: DC

## 2023-05-14 MED ORDER — ACETAMINOPHEN 650 MG RE SUPP: 650.0000 mg | Freq: Four times a day (QID) | RECTAL | Status: DC | PRN

## 2023-05-14 MED ORDER — MONTELUKAST SODIUM 10 MG PO TABS
10.0000 mg | ORAL_TABLET | Freq: Every day | ORAL | Status: DC
  Administered 2023-05-14: 10 mg via ORAL
  Filled 2023-05-14: qty 1

## 2023-05-14 MED ORDER — GABAPENTIN 400 MG PO CAPS
400.0000 mg | ORAL_CAPSULE | Freq: Three times a day (TID) | ORAL | Status: DC
  Administered 2023-05-14: 400 mg via ORAL
  Filled 2023-05-14: qty 1

## 2023-05-14 MED ORDER — BAQSIMI TWO PACK 3 MG/DOSE NA POWD: 3.0000 mg | NASAL | 0 refills | Status: DC | PRN

## 2023-05-14 MED ORDER — CHLORHEXIDINE GLUCONATE CLOTH 2 % EX PADS
6.0000 | MEDICATED_PAD | Freq: Every day | CUTANEOUS | Status: DC
  Administered 2023-05-14: 6 via TOPICAL

## 2023-05-14 MED ORDER — BAQSIMI TWO PACK 3 MG/DOSE NA POWD: 3.0000 mg | NASAL | 0 refills | Status: AC | PRN

## 2023-05-14 MED ORDER — POLYETHYLENE GLYCOL 3350 17 G PO PACK: 17.0000 g | PACK | Freq: Every day | ORAL | Status: DC | PRN

## 2023-05-14 MED ORDER — VALACYCLOVIR HCL 500 MG PO TABS
500.0000 mg | ORAL_TABLET | Freq: Two times a day (BID) | ORAL | Status: DC
Start: 2023-05-14 — End: 2023-05-14
  Administered 2023-05-14: 500 mg via ORAL
  Filled 2023-05-14 (×9): qty 1

## 2023-05-14 MED ORDER — INSULIN GLARGINE-YFGN 100 UNIT/ML ~~LOC~~ SOLN
10.0000 [IU] | Freq: Once | SUBCUTANEOUS | Status: AC
  Administered 2023-05-14: 10 [IU] via SUBCUTANEOUS
  Filled 2023-05-14: qty 0.1

## 2023-05-14 MED ORDER — LANCETS MISC: 1.0000 | Freq: Three times a day (TID) | 0 refills | Status: DC

## 2023-05-14 NOTE — Discharge Summary (Signed)
Physician Discharge Summary  Jeff Day WGN:[id number] DOB: 09-16-1965 DOA: 05/13/2023  PCP: Sheliah Hatch, PA-C  Admit date: 05/13/2023  Discharge date: 05/14/2023  Admitted From:Home  Disposition:  Home  Recommendations for Outpatient Follow-up:  Follow up with PCP in 1-2 weeks Remain on insulin basal and bolus as prescribed below and avoid further use of 70/30 Continue other oral home medications as below and monitor blood glucose closely  Home Health: None  Equipment/Devices: None  Discharge Condition:Stable  CODE STATUS: Full  Diet recommendation: Heart Healthy/carb modified  Brief/Interim Summary: Jeff Day is a 58 y.o. male with medical history significant for hypertension, diabetes mellitus, depression and anxiety. Patient presented to the ED with complaints of sudden onset of nausea and multiple episodes of vomiting that started about 1 AM early this morning, he also reports multiple watery stools.  Reports abdominal pain also, but this is consistent with his chronic intermittent abdominal pain.  Patient was admitted with DKA and claims to be mostly compliant with his home medications, but missed a day of taking them.  He has been seen by diabetes coordinator with recommendations to switch to basal bolus regimen which has been prescribed below.  His DKA has resolved and he is now tolerating diet.  No other acute events or concerns noted.  He will continue to monitor blood glucose at home and follow-up with PCP.  Discharge Diagnoses:  Principal Problem:   Intractable vomiting Active Problems:   DKA (diabetic ketoacidosis) (HCC)   Essential hypertension   DMII (diabetes mellitus, type 2) (HCC)   Depression with anxiety   Intractable nausea and vomiting  Principal discharge diagnosis: DKA in the setting of insulin-dependent type 2 diabetes.  Discharge Instructions  Discharge Instructions     Diet - low sodium heart healthy   Complete by: As directed     Increase activity slowly   Complete by: As directed    No wound care   Complete by: As directed       Allergies as of 05/14/2023   No Known Allergies      Medication List     STOP taking these medications    HumuLIN 70/30 KwikPen (70-30) 100 UNIT/ML KwikPen Generic drug: insulin isophane & regular human KwikPen       TAKE these medications    Accu-Chek Aviva Plus test strip Generic drug: glucose blood USE AS DIRECTED What changed: Another medication with the same name was added. Make sure you understand how and when to take each.   BLOOD GLUCOSE TEST STRIPS Strp 1 each by Does not apply route 3 (three) times daily. Use as directed to check blood sugar. May dispense any manufacturer covered by patient's insurance and fits patient's device. What changed: You were already taking a medication with the same name, and this prescription was added. Make sure you understand how and when to take each.   Baqsimi Two Pack 3 MG/DOSE Powd Generic drug: Glucagon Place 3 mg into the nose as needed for up to 2 doses (Severe low blood sugar). Give 3 mg (one actuation) into a single nostril.   Blood Glucose Monitoring Suppl Devi 1 each by Does not apply route 3 (three) times daily. May dispense any manufacturer covered by patient's insurance.   buPROPion 150 MG 24 hr tablet Commonly known as: WELLBUTRIN XL Take 150 mg by mouth daily.   CareFine Pen Needles 32G X 6 MM Misc Generic drug: Insulin Pen Needle Use to inject insulin twice daily What changed:  Another medication with the same name was added. Make sure you understand how and when to take each.   Pen Needles 31G X 5 MM Misc 1 each by Does not apply route 3 (three) times daily. May dispense any manufacturer covered by patient's insurance. What changed: You were already taking a medication with the same name, and this prescription was added. Make sure you understand how and when to take each.   escitalopram 20 MG  tablet Commonly known as: LEXAPRO Take 20 mg by mouth daily.   gabapentin 400 MG capsule Commonly known as: NEURONTIN Take 1 capsule by mouth 3 (three) times daily.   HYDROcodone-acetaminophen 5-325 MG tablet Commonly known as: NORCO/VICODIN Take 1 tablet by mouth every 6 (six) hours as needed for moderate pain (pain score 4-6).   insulin aspart 100 UNIT/ML FlexPen Commonly known as: NOVOLOG Inject 5 Units into the skin 3 (three) times daily with meals. If eating and Blood Glucose (BG) 80 or higher inject 5 units for meal coverage and add correction dose per scale. If not eating, correction dose only. BG <150= 0 unit; BG 150-200= 1 unit; BG 201-250= 2 unit; BG 251-300= 3 unit; BG 301-350= 4 unit; BG 351-400= 5 unit; BG >400= 6 unit and Call Primary Care.   insulin glargine 100 UNIT/ML Solostar Pen Commonly known as: LANTUS Inject 30 Units into the skin daily. May substitute as needed per insurance.   Januvia 100 MG tablet Generic drug: sitaGLIPtin Take 1 tablet (100 mg total) by mouth daily.   Lancet Device Misc 1 each by Does not apply route 3 (three) times daily. May dispense any manufacturer covered by patient's insurance.   Lancets Misc 1 each by Does not apply route 3 (three) times daily. Use as directed to check blood sugar. May dispense any manufacturer covered by patient's insurance and fits patient's device.   levocetirizine 5 MG tablet Commonly known as: XYZAL Take 5 mg by mouth every evening.   lisinopril 10 MG tablet Commonly known as: ZESTRIL Take 10 mg by mouth daily.   meloxicam 7.5 MG tablet Commonly known as: MOBIC Take 1 tablet by mouth daily.   metFORMIN 1000 MG tablet Commonly known as: GLUCOPHAGE Take 1 tablet (1,000 mg total) by mouth 2 (two) times daily with a meal.   montelukast 10 MG tablet Commonly known as: SINGULAIR Take 10 mg by mouth daily.   pantoprazole 40 MG tablet Commonly known as: PROTONIX Take 1 tablet (40 mg total) by mouth 2  (two) times daily.   rosuvastatin 20 MG tablet Commonly known as: CRESTOR Take 20 mg by mouth daily.   valACYclovir 500 MG tablet Commonly known as: VALTREX Take 500 mg by mouth 2 (two) times daily.        Follow-up Information     Sheliah Hatch, PA-C. Schedule an appointment as soon as possible for a visit in 1 week(s).   Specialty: Family Medicine Contact information: [address] [place] [address] [phone]                No Known Allergies  Consultations: None   Procedures/Studies: CT ABDOMEN PELVIS W CONTRAST Result Date: 05/13/2023 CLINICAL DATA:  General abdominal pain, nausea, vomiting EXAM: CT ABDOMEN AND PELVIS WITH CONTRAST TECHNIQUE: Multidetector CT imaging of the abdomen and pelvis was performed using the standard protocol following bolus administration of intravenous contrast. RADIATION DOSE REDUCTION: This exam was performed according to the departmental dose-optimization program which includes automated exposure control,  adjustment of the mA and/or kV according to patient size and/or use of iterative reconstruction technique. CONTRAST:  OMNIPAQUE IOHEXOL 300 MG/ML  SOLN COMPARISON:  11/07/2022 FINDINGS: Lower chest: No acute abnormality Hepatobiliary: No focal hepatic abnormality. Gallbladder unremarkable. Pancreas: No focal abnormality or ductal dilatation. Spleen: No focal abnormality.  Normal size. Adrenals/Urinary Tract: No adrenal abnormality. No focal renal abnormality. No stones or hydronephrosis. Urinary bladder is unremarkable. Stomach/Bowel: Normal appendix Stomach, large and small bowel grossly unremarkable. Vascular/Lymphatic: Aortic atherosclerosis. No evidence of aneurysm or adenopathy. Reproductive: No visible focal abnormality. Other: No free fluid or free air. Musculoskeletal: No acute bony abnormality. IMPRESSION: No acute findings in the abdomen or pelvis. Aortic atherosclerosis. Electronically Signed   By: Charlett Nose M.D.   On: 05/13/2023 18:32   DG Chest Port 1 View Result Date: 05/13/2023 CLINICAL DATA:  Chest pain today. Nausea and vomiting since last night. Ex-smoker. The patient currently vapes. EXAM: PORTABLE CHEST 1 VIEW COMPARISON:  11/07/2022 FINDINGS: The cardiac silhouette is near the upper limit of normal in size. Clear lungs with normal vascularity. Stable mildly prominent interstitial markings without Kerley lines or pleural fluid. Unremarkable bones. IMPRESSION: 1. No acute abnormality. 2. Stable mild chronic interstitial lung disease. Electronically Signed   By: Beckie Salts M.D.   On: 05/13/2023 16:12   US Abdomen Complete Result Date: 05/05/2023 CLINICAL DATA:  Nausea. EXAM: ABDOMEN ULTRASOUND COMPLETE COMPARISON:  CT C AP 11/07/2022 FINDINGS: Gallbladder: No gallstones or wall thickening visualized. No sonographic Murphy sign noted by sonographer. Common bile duct: Diameter: 3.3 mm Liver: Increased echogenicity. No focal lesion. Portal vein is patent on color Doppler imaging with normal direction of blood flow towards the liver. IVC: No abnormality visualized. Pancreas: Visualized portion unremarkable. Spleen: 14 cm, enlarged Right Kidney: Length: 12.5 cm. Echogenicity within normal limits. No mass or hydronephrosis visualized. Left Kidney: Length: 13.1 cm. Echogenicity within normal limits. No mass or hydronephrosis visualized. Abdominal aorta: No aneurysm visualized. Other findings: None. IMPRESSION: 1. Increased hepatic parenchymal echogenicity suggestive of steatosis. 2. No cholelithiasis or sonographic evidence for acute cholecystitis. 3. Splenomegaly. Electronically Signed   By: Annia Belt M.D.   On: 05/05/2023 09:11     Discharge Exam: Vitals:   05/14/23 0800 05/14/23 0900  BP: (!) 152/42 (!) 167/55  Pulse: (!) 56 (!) 58  Resp:    Temp: 98.2 F (36.8 C)   SpO2: 98% 98%   Vitals:   05/14/23 0600 05/14/23 0700 05/14/23 0800 05/14/23 0900  BP: (!) 135/39 (!) 154/58 (!) 152/42  (!) 167/55  Pulse: (!) 56 (!) 59 (!) 56 (!) 58  Resp: 18     Temp:   98.2 F (36.8 C)   TempSrc:   Oral   SpO2: 98% 97% 98% 98%  Weight:      Height:        General: Pt is alert, awake, not in acute distress Cardiovascular: RRR, S1/S2 +, no rubs, no gallops Respiratory: CTA bilaterally, no wheezing, no rhonchi Abdominal: Soft, NT, ND, bowel sounds + Extremities: no edema, no cyanosis    The results of significant diagnostics from this hospitalization (including imaging, microbiology, ancillary and laboratory) are listed below for reference.     Microbiology: Recent Results (from the past 240 hours)  MRSA Next Gen by PCR, Nasal     Status: None   Collection Time: 05/14/23  2:33 AM   Specimen: Nasal Mucosa; Nasal Swab  Result Value Ref Range Status   MRSA by PCR Next  Gen NOT DETECTED NOT DETECTED Final    Comment: (NOTE) The GeneXpert MRSA Assay (FDA approved for NASAL specimens only), is one component of a comprehensive MRSA colonization surveillance program. It is not intended to diagnose MRSA infection nor to guide or monitor treatment for MRSA infections. Test performance is not FDA approved in patients less than 29 years old. Performed at [facility], [address] [place], [place] [address]      Labs: BNP (last 3 results) No results for input(s): "BNP" in the last 8760 hours. Basic Metabolic Panel: Recent Labs  Lab 05/13/23 1600 05/13/23 1957 05/14/23 0111 05/14/23 0450 05/14/23 0938  NA 137 138 136 134* 137  K 4.2 4.9 3.9 3.5 3.7  CL 97* 102 102 104 104  CO2 20* 17* 23 24 22   GLUCOSE 329* 350* 222* 201* 174*  BUN 17 16 16 16 15   CREATININE 0.95 0.98 0.96 0.82 0.85  CALCIUM 9.6 9.5 8.6* 8.5* 8.7*   Liver Function Tests: Recent Labs  Lab 05/13/23 1600  AST 20  ALT 20  ALKPHOS 55  BILITOT 1.0  PROT 7.4  ALBUMIN 3.9   Recent Labs  Lab 05/13/23 1600  LIPASE 37   No results for input(s): "AMMONIA" in the last 168 hours. CBC: Recent Labs   Lab 05/13/23 1600 05/14/23 0450  WBC 15.9* 10.5  HGB 12.7* 10.4*  HCT 37.8* 32.0*  MCV 85.7 85.8  PLT 310 255   Cardiac Enzymes: No results for input(s): "CKTOTAL", "CKMB", "CKMBINDEX", "TROPONINI" in the last 168 hours. BNP: Invalid input(s): "POCBNP" CBG: Recent Labs  Lab 05/14/23 0607 05/14/23 0707 05/14/23 0825 05/14/23 0907 05/14/23 1009  GLUCAP 181* 187* 156* 179* 159*   D-Dimer No results for input(s): "DDIMER" in the last 72 hours. Hgb A1c No results for input(s): "HGBA1C" in the last 72 hours. Lipid Profile No results for input(s): "CHOL", "HDL", "LDLCALC", "TRIG", "CHOLHDL", "LDLDIRECT" in the last 72 hours. Thyroid function studies No results for input(s): "TSH", "T4TOTAL", "T3FREE", "THYROIDAB" in the last 72 hours.  Invalid input(s): "FREET3" Anemia work up No results for input(s): "VITAMINB12", "FOLATE", "FERRITIN", "TIBC", "IRON", "RETICCTPCT" in the last 72 hours. Urinalysis    Component Value Date/Time   COLORURINE STRAW (A) 05/13/2023 1745   APPEARANCEUR CLEAR 05/13/2023 1745   LABSPEC 1.026 05/13/2023 1745   PHURINE 6.0 05/13/2023 1745   GLUCOSEU >=500 (A) 05/13/2023 1745   HGBUR SMALL (A) 05/13/2023 1745   BILIRUBINUR NEGATIVE 05/13/2023 1745   BILIRUBINUR neg 08/11/2014 0854   KETONESUR 80 (A) 05/13/2023 1745   PROTEINUR 30 (A) 05/13/2023 1745   UROBILINOGEN 0.2 08/11/2014 0854   NITRITE NEGATIVE 05/13/2023 1745   LEUKOCYTESUR NEGATIVE 05/13/2023 1745   Sepsis Labs Recent Labs  Lab 05/13/23 1600 05/14/23 0450  WBC 15.9* 10.5   Microbiology Recent Results (from the past 240 hours)  MRSA Next Gen by PCR, Nasal     Status: None   Collection Time: 05/14/23  2:33 AM   Specimen: Nasal Mucosa; Nasal Swab  Result Value Ref Range Status   MRSA by PCR Next Gen NOT DETECTED NOT DETECTED Final    Comment: (NOTE) The GeneXpert MRSA Assay (FDA approved for NASAL specimens only), is one component of a comprehensive MRSA colonization  surveillance program. It is not intended to diagnose MRSA infection nor to guide or monitor treatment for MRSA infections. Test performance is not FDA approved in patients less than 5 years old. Performed at [facility], [address] [place], [place] [address]  Time coordinating discharge: 35 minutes  SIGNED:   Erick Blinks, DO [facility] 05/14/2023, 11:01 AM  If 7PM-7AM, please contact night-coverage [email or web address]

## 2023-05-14 NOTE — Plan of Care (Signed)

## 2023-05-14 NOTE — Progress Notes (Signed)
eLink Physician-Brief Progress Note Patient Name: SLAYTON LUBITZ DOB: Dec 05, 1965 MRN: [id number]   Date of Service  05/14/2023  HPI/Events of Note  Patient seen in the emergency department with intractable nausea and vomiting, and found to be in DKA.  eICU Interventions  New Patient Evaluation.        Thomasene Lot Ebba Goll 05/14/2023, 2:55 AM

## 2023-05-14 NOTE — Progress Notes (Signed)
   05/14/23 1139  TOC Brief Assessment  Insurance and Status Reviewed  Patient has primary care physician Yes  Home environment has been reviewed from home  Prior level of function: independent  Prior/Current Home Services No current home services  Social Drivers of Health Review SDOH reviewed no interventions necessary  Readmission risk has been reviewed Yes  Transition of care needs no transition of care needs at this time    Transition of Care Department [facility]) has reviewed patient and no TOC needs have been identified at this time. We will continue to monitor patient advancement through interdisciplinary progression rounds. If new patient transition needs arise, please place a TOC consult.

## 2023-05-14 NOTE — Inpatient Diabetes Management (Addendum)
Inpatient Diabetes Program Recommendations  AACE/ADA: New Consensus Statement on Inpatient Glycemic Control (2015)  Target Ranges:  Prepandial:   less than 140 mg/dL      Peak postprandial:   less than 180 mg/dL (1-2 hours)      Critically ill patients:  140 - 180 mg/dL   Lab Results  Component Value Date   GLUCAP 179 (H) 05/14/2023   HGBA1C 8.4 (A) 05/07/2023    Latest Reference Range & Units 05/13/23 16:00 05/14/23 04:50  Beta-Hydroxybutyric Acid 0.05 - 0.27 mmol/L 5.95 (H) 0.13  (H): Data is abnormally high  Diabetes history: DM2 Outpatient Diabetes medications: 70/30 20 units ac breakfast, 30 units ac dinner, Januvia 100 mg daily, Metformin 1000 mg BID  Current orders for Inpatient glycemic control: Novolog 0-9 units q 4 hrs.  Inpatient Diabetes Program Recommendations:   Spoke with patient via phone (DM coordinator @ [facility]). Patient didn't take his prescription 70/30 insulin due to not eating but took his Metformin and Januvia. Reviewed with patient he needs to check CBGs or use CGM to monitor his glucose levels and take portion of insulin. Patient plans to discuss with Ronny Bacon NP about plans for DKA. Patient states willingness to be on basal bolus insulin doses if would improve his CBGs. Patient normally wears a Libre 3 CGM but it was off yesterday when he was sick. Please consider: -Semglee 30 units daily starting in am (add additional 20 units today-already received 10 units) -Novolog 4 units tid meal coverage if eats 50% meals  Thank you, Billy Fischer. Danne Vasek, RN, MSN, CDCES  Diabetes Coordinator Inpatient Glycemic Control Team Team Pager [phone] (8am-5pm) 05/14/2023 9:47 AM

## 2023-05-17 ENCOUNTER — Telehealth: Payer: Self-pay | Admitting: *Deleted

## 2023-05-17 NOTE — Telephone Encounter (Signed)
Patient left a message,He shares that he has been in the hospital. They changed his medication. He was put on a new medication and was told not to take another. He is wanting to be sure with Whitney on what to do.

## 2023-05-17 NOTE — Telephone Encounter (Signed)
Talked with the patient. He shared that he had real bad diarrhea and this is why he called 911 and went to the hospital. They added Baqsimi. He was advised to stop the  Novolin 70/30 insulin. I told the patient that it looks like he was to be taking Lantus 30 units at bedtime , Novolog 5 units three times a day sliding scale, Metformin 1000 mg twice a day with a meal. Patient is currently in [place]. He was advised that I would share with Whitney, once she reviewed and gave her recommendations that I eould call him back.

## 2023-05-18 NOTE — Telephone Encounter (Signed)
Patient was called and made aware.

## 2023-05-18 NOTE — Telephone Encounter (Signed)
The regimen they changed him to is fine.  I was only trying to keep him on his same medications that he started out here (the premixed insulin) and also because it was only 2 injections a day vs 4 injections.  Have him keep a close eye on glucose levels and reach out if anything is off and we can make some changes where needed.

## 2023-06-07 ENCOUNTER — Encounter: Payer: Self-pay | Admitting: Nurse Practitioner

## 2023-06-08 ENCOUNTER — Other Ambulatory Visit (HOSPITAL_COMMUNITY): Payer: Self-pay | Admitting: Nurse Practitioner

## 2023-06-08 DIAGNOSIS — R1011 Right upper quadrant pain: Secondary | ICD-10-CM

## 2023-06-08 DIAGNOSIS — R112 Nausea with vomiting, unspecified: Secondary | ICD-10-CM

## 2023-06-09 ENCOUNTER — Ambulatory Visit: Payer: Medicaid Other | Admitting: Podiatry

## 2023-06-14 ENCOUNTER — Encounter (HOSPITAL_COMMUNITY)
Admission: RE | Admit: 2023-06-14 | Discharge: 2023-06-14 | Disposition: A | Discharge status: Home / Self Care | Payer: Medicaid Other | Source: Ambulatory Visit | Attending: Nurse Practitioner | Admitting: Nurse Practitioner

## 2023-06-14 DIAGNOSIS — R1011 Right upper quadrant pain: Secondary | ICD-10-CM

## 2023-06-14 DIAGNOSIS — R112 Nausea with vomiting, unspecified: Secondary | ICD-10-CM | POA: Diagnosis present

## 2023-06-14 MED ORDER — SINCALIDE 5 MCG IJ SOLR
INTRAMUSCULAR | Status: AC
  Filled 2023-06-14: qty 5

## 2023-06-14 MED ORDER — STERILE WATER FOR INJECTION IJ SOLN
INTRAMUSCULAR | Status: AC
  Filled 2023-06-14: qty 10

## 2023-06-14 MED ORDER — TECHNETIUM TC 99M MEBROFENIN IV KIT
5.0000 | PACK | Freq: Once | INTRAVENOUS | Status: AC | PRN
  Administered 2023-06-14: 5 via INTRAVENOUS

## 2023-06-16 ENCOUNTER — Ambulatory Visit (INDEPENDENT_AMBULATORY_CARE_PROVIDER_SITE_OTHER): Payer: Medicaid Other | Admitting: Podiatry

## 2023-06-16 DIAGNOSIS — E0843 Diabetes mellitus due to underlying condition with diabetic autonomic (poly)neuropathy: Secondary | ICD-10-CM | POA: Diagnosis not present

## 2023-06-16 DIAGNOSIS — I739 Peripheral vascular disease, unspecified: Secondary | ICD-10-CM

## 2023-06-16 NOTE — Progress Notes (Signed)
   Chief Complaint  Patient presents with   [facility]    RM#10 DFC/ NEUROPATHY patient states feet stay cold.    HPI: 58 y.o. male presenting today as a new patient for evaluation of diabetic foot exam and to discuss consistent coldness to the toes and feet.  Experiences very cold toes especially at night when he goes to bed.  This has been ongoing for several months.  He does have a history of smoking 20+ yrs 1-1.5ppd.  Patient states that he quit smoking about 9 years ago and occasionally vapes  Past Medical History:  Diagnosis Date   Arthritis    Back pain    Depression with anxiety    Diabetes (HCC)    GERD (gastroesophageal reflux disease)    Headache    Hyperlipemia    Hypertension    Sleep apnea    Wears dentures     Past Surgical History:  Procedure Laterality Date   CARDIAC CATHETERIZATION     Normal   CARPAL TUNNEL RELEASE     COLONOSCOPY     HAND SURGERY Right    2006    No Known Allergies   Physical Exam: General: The patient is alert and oriented x3 in no acute distress.  Dermatology: Skin is warm, dry and supple bilateral lower extremities.   Vascular: Skin is cold to touch especially to the toes.  Diminished pulses.  Delayed capillary refill.  Neurological: Light touch and protective threshold diminished  Musculoskeletal Exam: Hammertoe contracture deformity noted to the lesser digits bilateral  Assessment/Plan of Care: 1.  Diabetes mellitus with peripheral polyneuropathy 2.  Concern for PVD bilateral lower extremities       Felecia Shelling, DPM [facility]  Dr. Felecia Shelling, DPM    [address] [place], [place] [address]                Office [phone]  Fax [phone]

## 2023-06-18 ENCOUNTER — Ambulatory Visit (HOSPITAL_COMMUNITY)
Admission: RE | Admit: 2023-06-18 | Discharge: 2023-06-18 | Disposition: A | Discharge status: Home / Self Care | Payer: Medicaid Other | Source: Ambulatory Visit | Attending: Podiatry | Admitting: Podiatry

## 2023-06-18 DIAGNOSIS — E1143 Type 2 diabetes mellitus with diabetic autonomic (poly)neuropathy: Secondary | ICD-10-CM | POA: Insufficient documentation

## 2023-06-18 DIAGNOSIS — I10 Essential (primary) hypertension: Secondary | ICD-10-CM | POA: Insufficient documentation

## 2023-06-18 DIAGNOSIS — E785 Hyperlipidemia, unspecified: Secondary | ICD-10-CM | POA: Diagnosis not present

## 2023-06-18 DIAGNOSIS — E1151 Type 2 diabetes mellitus with diabetic peripheral angiopathy without gangrene: Secondary | ICD-10-CM | POA: Diagnosis not present

## 2023-06-18 DIAGNOSIS — I739 Peripheral vascular disease, unspecified: Secondary | ICD-10-CM | POA: Diagnosis not present

## 2023-06-18 DIAGNOSIS — E0843 Diabetes mellitus due to underlying condition with diabetic autonomic (poly)neuropathy: Secondary | ICD-10-CM | POA: Diagnosis not present

## 2023-06-18 LAB — VAS US ABI WITH/WO TBI
Left ABI: 1.19
Right ABI: 1.24

## 2023-07-22 ENCOUNTER — Telehealth: Payer: Self-pay | Admitting: *Deleted

## 2023-07-22 ENCOUNTER — Telehealth: Payer: Self-pay | Admitting: Nurse Practitioner

## 2023-07-22 NOTE — Telephone Encounter (Signed)
 Lower supper insulin dose to 20 units at supper for now.  It may be he is on too much insulin.

## 2023-07-22 NOTE — Telephone Encounter (Signed)
 Called pt gave him appt time sent Whitney new phone note.

## 2023-07-22 NOTE — Telephone Encounter (Signed)
 Pt states he is having bleeding in his eyes and wants his A1C checked as soon as he can.  Also, he states he is having readings between 60-80 in the mornings.  He would like to be seen sooner if at all possible.  States sometimes after eating sugar goes down instead of up.

## 2023-07-22 NOTE — Telephone Encounter (Signed)
 Patient would lie to know when his appointment is , and he would like to change it. He went to the eye doctor and was told that he had bleeding in his eyes and is being referred to [facility].

## 2023-07-22 NOTE — Telephone Encounter (Signed)
 Relayed Whitney's recommendations to pt.

## 2023-07-22 NOTE — Telephone Encounter (Signed)
Will forward to front desk to reschedule .

## 2023-07-28 ENCOUNTER — Other Ambulatory Visit (HOSPITAL_COMMUNITY): Payer: Self-pay | Admitting: Nurse Practitioner

## 2023-07-28 DIAGNOSIS — R112 Nausea with vomiting, unspecified: Secondary | ICD-10-CM

## 2023-08-02 ENCOUNTER — Ambulatory Visit (HOSPITAL_COMMUNITY)
Admission: RE | Admit: 2023-08-02 | Discharge: 2023-08-02 | Disposition: A | Discharge status: Home / Self Care | Source: Ambulatory Visit | Attending: Nurse Practitioner | Admitting: Nurse Practitioner

## 2023-08-02 DIAGNOSIS — R112 Nausea with vomiting, unspecified: Secondary | ICD-10-CM | POA: Diagnosis present

## 2023-08-02 MED ORDER — TECHNETIUM TC 99M SULFUR COLLOID
2.0000 | Freq: Once | INTRAVENOUS | Status: AC | PRN
  Administered 2023-08-02: 2 via ORAL

## 2023-09-06 ENCOUNTER — Ambulatory Visit: Payer: Medicaid Other | Admitting: Nurse Practitioner

## 2023-09-06 ENCOUNTER — Encounter: Payer: Self-pay | Admitting: Nurse Practitioner

## 2023-09-06 VITALS — BP 118/60 | HR 57 | Ht 71.0 in | Wt 193.4 lb

## 2023-09-06 DIAGNOSIS — Z794 Long term (current) use of insulin: Secondary | ICD-10-CM

## 2023-09-06 DIAGNOSIS — I10 Essential (primary) hypertension: Secondary | ICD-10-CM

## 2023-09-06 DIAGNOSIS — Z7984 Long term (current) use of oral hypoglycemic drugs: Secondary | ICD-10-CM

## 2023-09-06 DIAGNOSIS — E1165 Type 2 diabetes mellitus with hyperglycemia: Secondary | ICD-10-CM

## 2023-09-06 DIAGNOSIS — E782 Mixed hyperlipidemia: Secondary | ICD-10-CM | POA: Diagnosis not present

## 2023-09-06 LAB — POCT UA - MICROALBUMIN: Creatinine, POC: 200 mg/dL

## 2023-09-06 LAB — POCT GLYCOSYLATED HEMOGLOBIN (HGB A1C): Hemoglobin A1C: 6.8 % — AB (ref 4.0–5.6)

## 2023-09-06 NOTE — Progress Notes (Signed)
 Endocrinology Follow Up Note       09/06/2023, 9:56 AM   Subjective:    Patient ID: Jeff Day, male    DOB: April 20, 1966.  Jeff Day is being seen in follow up after being seen in consultation for management of currently uncontrolled symptomatic diabetes requested by  Stamey, Lowell Rude, FNP.   Past Medical History:  Diagnosis Date   Arthritis    Back pain    Depression with anxiety    Diabetes (HCC)    GERD (gastroesophageal reflux disease)    Headache    Hyperlipemia    Hypertension    Sleep apnea    Wears dentures     Past Surgical History:  Procedure Laterality Date   CARDIAC CATHETERIZATION     Normal   CARPAL TUNNEL RELEASE     COLONOSCOPY     HAND SURGERY Right    2006    Social History   Socioeconomic History   Marital status: Widowed    Spouse name: Not on file   Number of children: 0   Years of education: 9th grade   Highest education level: Not on file  Occupational History   Occupation: [employer] doctor  Tobacco Use   Smoking status: Every Day    Types: E-cigarettes   Smokeless tobacco: Former   Tobacco comments:    Quit 2001  Vaping Use   Vaping status: Every Day   Substances: Nicotine, THC  Substance and Sexual Activity   Alcohol use: Yes    Alcohol/week: 0.0 standard drinks of alcohol    Comment: 4 per month   Drug use: Yes    Types: Marijuana   Sexual activity: Not on file  Other Topics Concern   Not on file  Social History Narrative   Lives at home with friend.   Three cups caffeine  per day.   Right-handed.   Social Drivers of Corporate investment banker Strain: Not on file  Food Insecurity: No Food Insecurity (05/14/2023)   Hunger Vital Sign    Worried About Running Out of Food in the Last Year: Never true    Ran Out of Food in the Last Year: Never true  Transportation Needs: No Transportation Needs (05/14/2023)   PRAPARE - Scientist, research (physical sciences) (Medical): No    Lack of Transportation (Non-Medical): No  Physical Activity: Not on file  Stress: Not on file  Social Connections: Moderately Isolated (05/14/2023)   Social Connection and Isolation Panel [NHANES]    Frequency of Communication with Friends and Family: More than three times a week    Frequency of Social Gatherings with Friends and Family: Twice a week    Attends Religious Services: More than 4 times per year    Active Member of [employer] or Organizations: No    Attends Banker Meetings: Never    Marital Status: Widowed    Family History  Problem Relation Age of Onset   Obesity Mother    Other Mother        meningitis   Cirrhosis Father    Alcoholism Father    Diabetes Sister    Diabetes Paternal Actor  Diabetes Maternal Grandfather    Suicidality Brother     Outpatient Encounter Medications as of 09/06/2023  Medication Sig   Blood Glucose Monitoring Suppl DEVI 1 each by Does not apply route 3 (three) times daily. May dispense any manufacturer covered by patient's insurance.   buPROPion  (WELLBUTRIN  XL) 150 MG 24 hr tablet Take 150 mg by mouth daily.   escitalopram  (LEXAPRO ) 20 MG tablet Take 20 mg by mouth daily.   gabapentin  (NEURONTIN ) 400 MG capsule Take 1 capsule by mouth 3 (three) times daily.   Glucagon  (BAQSIMI  TWO PACK) 3 MG/DOSE POWD Place 3 mg into the nose as needed for up to 2 doses (Severe low blood sugar). Give 3 mg (one actuation) into a single nostril.   HYDROcodone-acetaminophen  (NORCO/VICODIN) 5-325 MG tablet Take 1 tablet by mouth every 6 (six) hours as needed for moderate pain (pain score 4-6).   insulin  aspart (NOVOLOG ) 100 UNIT/ML FlexPen Inject 5 Units into the skin 3 (three) times daily with meals. If eating and Blood Glucose (BG) 80 or higher inject 5 units for meal coverage and add correction dose per scale. If not eating, correction dose only. BG <150= 0 unit; BG 150-200= 1 unit; BG 201-250= 2 unit; BG 251-300=  3 unit; BG 301-350= 4 unit; BG 351-400= 5 unit; BG >400= 6 unit and Call Primary Care.   insulin  glargine (LANTUS ) 100 UNIT/ML Solostar Pen Inject 30 Units into the skin daily. May substitute as needed per insurance.   Insulin  Pen Needle (PEN NEEDLES) 31G X 5 MM MISC 1 each by Does not apply route 3 (three) times daily. May dispense any manufacturer covered by patient's insurance.   JANUVIA  100 MG tablet Take 1 tablet (100 mg total) by mouth daily.   levocetirizine (XYZAL ) 5 MG tablet Take 5 mg by mouth every evening.   lisinopril  (ZESTRIL ) 10 MG tablet Take 10 mg by mouth daily.   meloxicam (MOBIC) 7.5 MG tablet Take 1 tablet by mouth daily.   montelukast  (SINGULAIR ) 10 MG tablet Take 10 mg by mouth daily.   pantoprazole  (PROTONIX ) 40 MG tablet Take 1 tablet (40 mg total) by mouth 2 (two) times daily.   rosuvastatin  (CRESTOR ) 20 MG tablet Take 20 mg by mouth daily.   valACYclovir  (VALTREX ) 500 MG tablet Take 500 mg by mouth 2 (two) times daily.   [DISCONTINUED] metFORMIN  (GLUCOPHAGE ) 1000 MG tablet Take 1 tablet (1,000 mg total) by mouth 2 (two) times daily with a meal.   [DISCONTINUED] ACCU-CHEK AVIVA PLUS test strip USE AS DIRECTED   [DISCONTINUED] Glucose Blood (BLOOD GLUCOSE TEST STRIPS) STRP 1 each by Does not apply route 3 (three) times daily. Use as directed to check blood sugar. May dispense any manufacturer covered by patient's insurance and fits patient's device.   [DISCONTINUED] Insulin  Pen Needle (CAREFINE PEN NEEDLES) 32G X 6 MM MISC Use to inject insulin  twice daily   [DISCONTINUED] Lancet Device MISC 1 each by Does not apply route 3 (three) times daily. May dispense any manufacturer covered by patient's insurance.   [DISCONTINUED] Lancets MISC 1 each by Does not apply route 3 (three) times daily. Use as directed to check blood sugar. May dispense any manufacturer covered by patient's insurance and fits patient's device.   No facility-administered encounter medications on file as of  09/06/2023.    ALLERGIES: No Known Allergies  VACCINATION STATUS: There is no immunization history for the selected administration types on file for this patient.  Diabetes He presents for his follow-up diabetic visit.  He has type 2 diabetes mellitus. Onset time: diagnosed at approx age of 37. His disease course has been improving. Hypoglycemia symptoms include nervousness/anxiousness, sweats and tremors. Associated symptoms include fatigue. Pertinent negatives for diabetes include no foot paresthesias, no polydipsia, no polyuria and no visual change. There are no hypoglycemic complications. Symptoms are improving. Diabetic complications include nephropathy, peripheral neuropathy and retinopathy. (DM foot ulcers) Risk factors for coronary artery disease include diabetes mellitus, dyslipidemia, family history, male sex, hypertension, tobacco exposure and sedentary lifestyle. Current diabetic treatment includes oral agent (dual therapy) and intensive insulin  program. He is compliant with treatment most of the time. His weight is stable. He is following a generally unhealthy diet. When asked about meal planning, he reported none. He has not had a previous visit with a dietitian. He participates in exercise intermittently. His home blood glucose trend is decreasing steadily. His overall blood glucose range is 140-180 mg/dl. (He presents today with his CGM showing mostly at target glycemic profile.  His POCT A1c today is 6.8%, improving from last visit of 8.4%.  Analysis of his CGM shows TIR 64%, TAR 35%, TBR 1% with a GMI of 7.3%.  He was changed to MDI between visits.  He has had some stomach troubles with nausea, vomiting, diarrhea, says these are improving but not yet resolved.) An ACE inhibitor/angiotensin II receptor blocker is being taken. He does not see a podiatrist.Eye exam is current.   Review of systems  Constitutional: + decreasing body weight,  current Body mass index is 26.97 kg/m. , no  fatigue, no subjective hyperthermia, no subjective hypothermia Eyes: no blurry vision, no xerophthalmia ENT: no sore throat, no nodules palpated in throat, no dysphagia/odynophagia, no hoarseness Cardiovascular: no chest pain, no shortness of breath, no palpitations, no leg swelling Respiratory: no cough, no shortness of breath Gastrointestinal: + intermittent nausea/vomiting/diarrhea- following with GI Musculoskeletal: no muscle/joint aches Skin: no rashes, no hyperemia Neurological: no tremors, no numbness, no tingling, no dizziness Psychiatric: no depression, no anxiety  Objective:     BP 118/60 (BP Location: Left Arm, Patient Position: Sitting, Cuff Size: Large)   Pulse (!) 57 Comment: Retake radial pulse  Ht 5\' 11"  (1.803 m)   Wt 193 lb 6.4 oz (87.7 kg)   BMI 26.97 kg/m   Wt Readings from Last 3 Encounters:  09/06/23 193 lb 6.4 oz (87.7 kg)  05/14/23 208 lb 8.9 oz (94.6 kg)  05/07/23 213 lb 9.6 oz (96.9 kg)     BP Readings from Last 3 Encounters:  09/06/23 118/60  05/14/23 (!) 167/55  05/07/23 130/70     Physical Exam- Limited  Constitutional:  Body mass index is 26.97 kg/m. , not in acute distress, normal state of mind Eyes:  EOMI, no exophthalmos Musculoskeletal: no gross deformities, strength intact in all four extremities, no gross restriction of joint movements Skin:  no rashes, no hyperemia Neurological: no tremor with outstretched hands   Diabetic Foot Exam - Simple   Simple Foot Form Diabetic Foot exam was performed with the following findings: Yes 09/06/2023  9:45 AM  Visual Inspection See comments: Yes Sensation Testing Intact to touch and monofilament testing bilaterally: Yes Pulse Check Posterior Tibialis and Dorsalis pulse intact bilaterally: Yes Comments 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     CMP ( most recent) CMP     Component Value Date/Time   NA 137 05/14/2023 0938   K 3.7 05/14/2023 0938   CL 104 05/14/2023 0938   CO2 22  05/14/2023 1308  GLUCOSE 174 (H) 05/14/2023 0938   BUN 15 05/14/2023 0938   BUN 21 10/26/2022 0000   CREATININE 0.85 05/14/2023 0938   CALCIUM  8.7 (L) 05/14/2023 0938   PROT 7.4 05/13/2023 1600   ALBUMIN 3.9 05/13/2023 1600   AST 20 05/13/2023 1600   ALT 20 05/13/2023 1600   ALKPHOS 55 05/13/2023 1600   BILITOT 1.0 05/13/2023 1600   EGFR 63 10/26/2022 0000   GFRNONAA >60 05/14/2023 1610     Diabetic Labs (most recent): Lab Results  Component Value Date   HGBA1C 6.8 (A) 09/06/2023   HGBA1C 8.4 (A) 05/07/2023   HGBA1C 12.9 (H) 11/07/2022   MICROALBUR 30mg /L 09/06/2023     Lipid Panel ( most recent) Lipid Panel     Component Value Date/Time   TRIG 323 (A) 10/26/2022 0000   LDLCALC 29 10/26/2022 0000      Lab Results  Component Value Date   TSH 1.653 11/08/2022           Assessment & Plan:   1) Type 2 Diabetes with hyperglycemia with long-term current use of insulin   He presents today with his CGM showing mostly at target glycemic profile.  His POCT A1c today is 6.8%, improving from last visit of 8.4%.  Analysis of his CGM shows TIR 64%, TAR 35%, TBR 1% with a GMI of 7.3%.  He was changed to MDI between visits.  He has had some stomach troubles with nausea, vomiting, diarrhea, says these are improving but not yet resolved.  - Jeff Day has currently uncontrolled symptomatic type 2 DM since 58 years of age.   -Recent labs reviewed.  - I had a long discussion with him about the progressive nature of diabetes and the pathology behind its complications. -his diabetes is complicated by CKD stage 2, DM foot ulcers and he remains at a high risk for more acute and chronic complications which include CAD, CVA, CKD, retinopathy, and neuropathy. These are all discussed in detail with him.  The following Lifestyle Medicine recommendations according to American College of Lifestyle Medicine [facility]) were discussed and offered to patient and he agrees to start the journey:   A. Whole Foods, Plant-based plate comprising of fruits and vegetables, plant-based proteins, whole-grain carbohydrates was discussed in detail with the patient.   A list for source of those nutrients were also provided to the patient.  Patient will use only water  or unsweetened tea for hydration. B.  The need to stay away from risky substances including alcohol, smoking; obtaining 7 to 9 hours of restorative sleep, at least 150 minutes of moderate intensity exercise weekly, the importance of healthy social connections,  and stress reduction techniques were discussed. C.  A full color page of  Calorie density of various food groups per pound showing examples of each food groups was provided to the patient.  - Nutritional counseling repeated at each appointment due to patients tendency to fall back in to old habits.  - The patient admits there is a room for improvement in their diet and drink choices. -  Suggestion is made for the patient to avoid simple carbohydrates from their diet including Cakes, Sweet Desserts / Pastries, Ice Cream, Soda (diet and regular), Sweet Tea, Candies, Chips, Cookies, Sweet Pastries, Store Bought Juices, Alcohol in Excess of 1-2 drinks a day, Artificial Sweeteners, Coffee Creamer, and "Sugar-free" Products. This will help patient to have stable blood glucose profile and potentially avoid unintended weight gain.   - I encouraged the patient to  switch to unprocessed or minimally processed complex starch and increased protein intake (animal or plant source), fruits, and vegetables.   - Patient is advised to stick to a routine mealtimes to eat 3 meals a day and avoid unnecessary snacks (to snack only to correct hypoglycemia).  - I have approached him with the following individualized plan to manage his diabetes and patient agrees:   -He is advised to continue Lantus  30 units SQ nightly, Novolog  1-7 units TID with meals if glucose is above 90 and he is eating (Specific  instructions on how to titrate insulin  dosage based on glucose readings given to patient in writing), and continue Januvia  100 mg po daily.  Will stop his Metformin  to see if it helps to improve his GI symptoms.    -he is encouraged to continue monitoring glucose 4 times daily (using his CGM), before meals and before bed, and to call the clinic if he has readings less than 70 or above 300 for 3 tests in a row.  - he is warned not to take insulin  without proper monitoring per orders. - Adjustment parameters are given to him for hypo and hyperglycemia in writing.  - his Farxiga was previously discontinued, to de-escalate treatment plan and help control cost.  - Specific targets for  A1c; LDL, HDL, and Triglycerides were discussed with the patient.  2) Blood Pressure /Hypertension:  his blood pressure is controlled to target.   he is advised to continue his current medications as prescribed by his PCP.  3) Lipids/Hyperlipidemia:    Review of his recent lipid panel from 10/26/22 showed controlled LDL at 29 and elevated triglycerides of 323 .  he is advised to continue Crestor  20 mg daily at bedtime.  Side effects and precautions discussed with him.  4)  Weight/Diet:  his Body mass index is 26.97 kg/m.  -    he is NOT a candidate for weight loss.  Exercise, and detailed carbohydrates information provided  -  detailed on discharge instructions.  5) Chronic Care/Health Maintenance: -he is on ACEI/ARB and Statin medications and is encouraged to initiate and continue to follow up with Ophthalmology, Dentist, Podiatrist at least yearly or according to recommendations, and advised to stay away from smoking. I have recommended yearly flu vaccine and pneumonia vaccine at least every 5 years; moderate intensity exercise for up to 150 minutes weekly; and sleep for at least 7 hours a day.  - he is advised to maintain close follow up with Stamey, Lowell Rude, FNP for primary care needs, as well as his other  providers for optimal and coordinated care.     I spent  41  minutes in the care of the patient today including review of labs from CMP, Lipids, Thyroid Function, Hematology (current and previous including abstractions from other facilities); face-to-face time discussing  his blood glucose readings/logs, discussing hypoglycemia and hyperglycemia episodes and symptoms, medications doses, his options of short and long term treatment based on the latest standards of care / guidelines;  discussion about incorporating lifestyle medicine;  and documenting the encounter. Risk reduction counseling performed per USPSTF guidelines to reduce obesity and cardiovascular risk factors.     Please refer to Patient Instructions for Blood Glucose Monitoring and Insulin /Medications Dosing Guide"  in media tab for additional information. Please  also refer to " Patient Self Inventory" in the Media  tab for reviewed elements of pertinent patient history.  Jeff Day participated in the discussions, expressed understanding, and voiced agreement with  the above plans.  All questions were answered to his satisfaction. he is encouraged to contact clinic should he have any questions or concerns prior to his return visit.     Follow up plan: - Return in about 4 months (around 01/07/2024) for Diabetes F/U with A1c in office, No previsit labs, Bring meter and logs.   Hulon Magic, [facility] [address] [place], [place] [address] Phone: [phone] Fax: [phone]  09/06/2023, 9:56 AM

## 2023-10-05 ENCOUNTER — Ambulatory Visit: Admitting: Dermatology

## 2023-11-19 DEATH — deceased

## 2023-11-24 ENCOUNTER — Ambulatory Visit: Admitting: Dermatology

## 2024-01-07 ENCOUNTER — Ambulatory Visit: Admitting: Nurse Practitioner

## 2024-01-07 DIAGNOSIS — E782 Mixed hyperlipidemia: Secondary | ICD-10-CM

## 2024-01-07 DIAGNOSIS — Z7984 Long term (current) use of oral hypoglycemic drugs: Secondary | ICD-10-CM

## 2024-01-07 DIAGNOSIS — I10 Essential (primary) hypertension: Secondary | ICD-10-CM

## 2024-01-07 DIAGNOSIS — Z794 Long term (current) use of insulin: Secondary | ICD-10-CM
